# Patient Record
Sex: Male | Born: 1975 | Hispanic: Yes | Marital: Married | Smoking: Former smoker
Health system: Southern US, Community
[De-identification: ages and names within clinical notes are randomized; demographics above are authoritative.]

## PROBLEM LIST (undated history)

## (undated) DIAGNOSIS — M549 Dorsalgia, unspecified: Secondary | ICD-10-CM

## (undated) DIAGNOSIS — G43909 Migraine, unspecified, not intractable, without status migrainosus: Secondary | ICD-10-CM

## (undated) DIAGNOSIS — K6389 Other specified diseases of intestine: Secondary | ICD-10-CM

## (undated) DIAGNOSIS — D649 Anemia, unspecified: Secondary | ICD-10-CM

## (undated) DIAGNOSIS — S6990XA Unspecified injury of unspecified wrist, hand and finger(s), initial encounter: Secondary | ICD-10-CM

## (undated) DIAGNOSIS — K219 Gastro-esophageal reflux disease without esophagitis: Secondary | ICD-10-CM

## (undated) DIAGNOSIS — S83006A Unspecified dislocation of unspecified patella, initial encounter: Secondary | ICD-10-CM

## (undated) DIAGNOSIS — S80819A Abrasion, unspecified lower leg, initial encounter: Secondary | ICD-10-CM

## (undated) DIAGNOSIS — E785 Hyperlipidemia, unspecified: Secondary | ICD-10-CM

## (undated) HISTORY — DX: Migraine, unspecified, not intractable, without status migrainosus: G43.909

## (undated) HISTORY — DX: Abrasion, unspecified lower leg, initial encounter: S80.819A

## (undated) HISTORY — DX: Hyperlipidemia, unspecified: E78.5

## (undated) HISTORY — DX: Unspecified injury of unspecified wrist, hand and finger(s), initial encounter: S69.90XA

## (undated) HISTORY — DX: Dorsalgia, unspecified: M54.9

## (undated) HISTORY — DX: Gastro-esophageal reflux disease without esophagitis: K21.9

## (undated) HISTORY — DX: Unspecified dislocation of unspecified patella, initial encounter: S83.006A

## (undated) HISTORY — DX: Other specified diseases of intestine: K63.89

---

## 2008-08-28 ENCOUNTER — Emergency Department (HOSPITAL_COMMUNITY): Admission: EM | Admit: 2008-08-28 | Discharge: 2008-08-28 | Payer: Self-pay | Admitting: Emergency Medicine

## 2009-08-23 ENCOUNTER — Ambulatory Visit: Payer: Self-pay | Admitting: Family Medicine

## 2009-08-23 DIAGNOSIS — M549 Dorsalgia, unspecified: Secondary | ICD-10-CM | POA: Insufficient documentation

## 2009-09-29 ENCOUNTER — Ambulatory Visit: Payer: Self-pay | Admitting: Family Medicine

## 2009-09-29 DIAGNOSIS — R040 Epistaxis: Secondary | ICD-10-CM

## 2009-09-29 DIAGNOSIS — R209 Unspecified disturbances of skin sensation: Secondary | ICD-10-CM | POA: Insufficient documentation

## 2009-10-04 ENCOUNTER — Telehealth: Payer: Self-pay | Admitting: *Deleted

## 2009-12-21 ENCOUNTER — Ambulatory Visit: Payer: Self-pay | Admitting: Family Medicine

## 2009-12-21 ENCOUNTER — Telehealth: Payer: Self-pay | Admitting: Family Medicine

## 2009-12-21 DIAGNOSIS — M999 Biomechanical lesion, unspecified: Secondary | ICD-10-CM | POA: Insufficient documentation

## 2010-01-11 ENCOUNTER — Ambulatory Visit: Payer: Self-pay | Admitting: Family Medicine

## 2010-01-11 DIAGNOSIS — E785 Hyperlipidemia, unspecified: Secondary | ICD-10-CM | POA: Insufficient documentation

## 2010-01-11 DIAGNOSIS — M25579 Pain in unspecified ankle and joints of unspecified foot: Secondary | ICD-10-CM

## 2010-01-11 DIAGNOSIS — R6882 Decreased libido: Secondary | ICD-10-CM | POA: Insufficient documentation

## 2010-01-17 ENCOUNTER — Encounter: Payer: Self-pay | Admitting: Family Medicine

## 2010-01-17 ENCOUNTER — Ambulatory Visit: Payer: Self-pay | Admitting: Family Medicine

## 2010-01-17 LAB — CONVERTED CEMR LAB
Cholesterol: 174 mg/dL (ref 0–200)
HDL: 40 mg/dL (ref >39)
Total CHOL/HDL Ratio: 4.4

## 2010-01-31 ENCOUNTER — Ambulatory Visit: Payer: Self-pay | Admitting: Family Medicine

## 2010-01-31 DIAGNOSIS — S83006A Unspecified dislocation of unspecified patella, initial encounter: Secondary | ICD-10-CM

## 2010-01-31 HISTORY — DX: Unspecified dislocation of unspecified patella, initial encounter: S83.006A

## 2010-02-17 ENCOUNTER — Encounter: Payer: Self-pay | Admitting: Family Medicine

## 2010-02-17 ENCOUNTER — Ambulatory Visit (HOSPITAL_COMMUNITY): Admission: RE | Admit: 2010-02-17 | Discharge: 2010-02-17 | Payer: Self-pay | Admitting: Family Medicine

## 2010-02-17 ENCOUNTER — Ambulatory Visit: Payer: Self-pay | Admitting: Family Medicine

## 2010-02-17 DIAGNOSIS — H6690 Otitis media, unspecified, unspecified ear: Secondary | ICD-10-CM | POA: Insufficient documentation

## 2010-02-18 ENCOUNTER — Encounter: Payer: Self-pay | Admitting: Family Medicine

## 2010-03-22 ENCOUNTER — Ambulatory Visit: Payer: Self-pay | Admitting: Family Medicine

## 2010-05-18 ENCOUNTER — Ambulatory Visit: Admit: 2010-05-18 | Payer: Self-pay

## 2010-05-24 NOTE — Letter (Signed)
Summary: Generic Letter  [facility]  [address]   [place], [place] [address]   Phone: [phone]  Fax: [phone]    02/18/2010  [facility] [address] [place], [place]  [address]  Dear Mr. JAIMES-GARCIA,     I have reviewed your xray results; it showed some mild degenerative spurring, but no acute boney abnormalities.  That means that no bones are broken and you have some chronic bone changes.  I think it is reasonable to try physical therapy at this point to see if that will help with the pain.  Please call my office when you have a chance and let me know if you would like to proceed with setting up physical therapy.       Sincerely,   Alvia Grove DO  Appended Document: Generic Letter mailed.
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
## 2010-05-24 NOTE — Progress Notes (Signed)
  Phone Note Call from Patient   Caller: Patient Summary of Call: Need to come in for workin because he's been feeling shaking since last Friday.  Do not have a cold or fever.  Just not feeling right.  Have an appt with Dr. Gomez Cleverly on 9/20.  Call him back at [phone] Initial call taken by: Britta Mccreedy mcgregor  Follow-up for Phone Call        states this has never happened before. using ibu for the pain. happens off & on. unclear what triggers it or how it stops. affects the whole body. to see Dr. Katrinka Blazing this pm Follow-up by: Golden Circle RN,  December 21, 2009 9:56 AM
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
## 2010-05-24 NOTE — Assessment & Plan Note (Signed)
Summary: flu shot/eo  Nurse Visit   Vital Signs:  Patient profile:   35 year old male Temp:     97.9 degrees F oral  Vitals Entered By: Theresia Lo RN (March 22, 2010 10:29 AM)  Allergies: No Known Drug Allergies  Immunizations Administered:  Influenza Vaccine # 1:    Vaccine Type: Fluvax 3+    Site: right deltoid    Mfr: [employer]Smith[employer]    Dose: 0.5 ml    Route: IM    Given by: Theresia Lo RN    Exp. Date: 10/22/2010    Lot #: [id number]    VIS given: 11/16/09 version given March 22, 2010.  Flu Vaccine Consent Questions:    Do you have a history of severe allergic reactions to this vaccine? no    Any prior history of allergic reactions to egg and/or gelatin? no    Do you have a sensitivity to the preservative Thimersol? no    Do you have a past history of Guillan-Barre Syndrome? no    Do you currently have an acute febrile illness? no    Have you ever had a severe reaction to latex? no    Vaccine information given and explained to patient? yes  Orders Added: 1)  Flu Vaccine 47yrs + [90658] 2)  Admin 1st Vaccine [30865]
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
## 2010-05-24 NOTE — Letter (Signed)
Summary: Lipid Letter  [facility]  [address]   [place], [place] [address]   Phone: [phone]  Fax: [phone]    02/17/2010  [facility] [address] [place], [place]  [address]  Dear Kelby Fam:  We have carefully reviewed your last lipid profile from 01/17/2010 and the results are noted below with a summary of recommendations for lipid management.    Cholesterol:       174     Goal: < 200   HDL "good" Cholesterol:   40     Goal: > 40   LDL "bad" Cholesterol:   119     Goal: < 120   Triglycerides:       76     Goal: < 150        TLC Diet (Therapeutic Lifestyle Change): Saturated Fats & Transfatty acids should be kept < 7% of total calories ***Reduce Saturated Fats Polyunstaurated Fat can be up to 10% of total calories Monounsaturated Fat Fat can be up to 20% of total calories Total Fat should be no greater than 25-35% of total calories Carbohydrates should be 50-60% of total calories Protein should be approximately 15% of total calories Fiber should be at least 20-30 grams a day ***Increased fiber may help lower LDL Total Cholesterol should be < 200mg /day Consider adding plant stanol/sterols to diet (example: Benacol spread) ***A higher intake of unsaturated fat may reduce Triglycerides and Increase HDL    Adjunctive Measures (may lower LIPIDS and reduce risk of Heart Attack) include: Aerobic Exercise (20-30 minutes 3-4 times a week) Limit Alcohol Consumption Weight Reduction Aspirin 75-81 mg a day by mouth (if not allergic or contraindicated) Dietary Fiber 20-30 grams a day by mouth     Current Medications: 1)    Px Ibuprofen 200 Mg Tabs (Ibuprofen) .... Take 3 tab by mouth three times a day as needed for back pain 2)    Flector 1.3 % Ptch (Diclofenac epolamine) .... Apply one patch q12 hours, up to 3 at a time 3)    Keflex 500 Mg Caps (Cephalexin) .Marland Kitchen.. 1 pill by mouth three times a day x 10 days  If you have any questions, please  call. We appreciate being able to work with you.   Sincerely,    [facility] . [facility]
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
## 2010-05-24 NOTE — Progress Notes (Signed)
Summary: meds prob  Phone Note Call from Patient Call back at Home Phone [phone]   Caller: Patient Summary of Call: pt states the [facility]'t get diclofenac anymore and needs it called to [facility] Initial call taken by: De Nurse,  October 04, 2009 3:46 PM  Follow-up for Phone Call       Follow-up by: Golden Circle RN,  October 04, 2009 3:48 PM    Prescriptions: DICLOFENAC SODIUM 75 MG TBEC (DICLOFENAC SODIUM) one by mouth two times a day; take with food  #60 x 1   Entered by:   Golden Circle RN   Authorized by:   Myrtie Soman  MD   Signed by:   Golden Circle RN on 10/04/2009   Method used:   Electronically to        [facility] [address] (retail)       [phone]       [place], [place]  [address]       Ph: [phone]       Fax: [phone]   RxID:   [id number]
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
## 2010-05-24 NOTE — Assessment & Plan Note (Signed)
Summary: f/u,df   Vital Signs:  Patient profile:   35 year old male Height:      64.5 inches Weight:      145.8 pounds BMI:     24.73 Temp:     98.5 degrees F oral Pulse rate:   57 / minute BP sitting:   107 / 62  (left arm) Cuff size:   regular  Vitals Entered By: Garen Grams LPN (January 11, 2010 4:44 PM) Is Patient Diabetic? No Pain Assessment Patient in pain? yes     Location: rt ankle   Primary Care Provider:  . [facility]   History of Present Illness: 35 yo male here for f/u ankle pain, concern of erectile dysfunction and back apin.  1. Ankle pain: Had a known injury to right ankle about 10 years ago while playing soccer.  Didn't break any bones, but used a soft cast for about 4 weeks.  Had returned to full function until about 6 mo ago, no injury, but pt states it has been aching and sore.  Pain is increasing and pt wonders if he should wear a brace?  Able to walk and perform ADL's, but pain is now nagging.  Has not tried anything for relief. 2. Erectile dysfunction:  Pt has been unable to retain erection during intercourse with his wife.  This has been happening for the past 8 months, but was intermittenet, now it is constant.  Able to get an erection, but cannot maintain it.  3. Back pain: low back and chronic, seen multiple different provider and dx has been msk. Pai radiates sometimes, worse at the end of a long day.  No weakness, somme numbness on outside of left leg since an injury when a kid but nothing new. Denies bowel or bladder problems.   Habits & Providers  Alcohol-Tobacco-Diet     Tobacco Status: never  Current Problems (verified): 1)  Hyperlipidemia  (ICD-272.4) 2)  Ankle Pain, Right  (ICD-719.47) 3)  Decreased Libido  (ICD-799.81) 4)  Somatic Dysfunction, Lumbar  (ICD-739.3) 5)  Disturbance of Skin Sensation  (ICD-782.0) 6)  Epistaxis, Recurrent  (ICD-784.7) 7)  Back Pain, Chronic  (ICD-724.5)  Current Medications (verified): 1)  Px  Ibuprofen 200 Mg Tabs (Ibuprofen) .... Take 3 Tab By Mouth Three Times A Day As Needed For Back Pain  Allergies (verified): No Known Drug Allergies  Family History: Reviewed history from 08/23/2009 and no changes required. no HTN, diabetes, cancer  does not know father's side of the family  no premature MI / CAD  Social History: Reviewed history from 08/23/2009 and no changes required. 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   Review of Systems  The patient denies anorexia, fever, weight loss, weight gain, vision loss, decreased hearing, hoarseness, chest pain, syncope, dyspnea on exertion, peripheral edema, prolonged cough, headaches, hemoptysis, abdominal pain, melena, hematochezia, severe indigestion/heartburn, hematuria, incontinence, genital sores, muscle weakness, suspicious skin lesions, transient blindness, difficulty walking, depression, unusual weight change, abnormal bleeding, enlarged lymph nodes, angioedema, breast masses, and testicular masses.    Physical Exam  General:  Vs reviewed, alert, well-developed, well-nourished, and well-hydrated.   Lungs:  work of breathing unlabored, clear to auscultation bilaterally; no wheezes, rales, or ronchi; good air movement throughout  Heart:  regular rate and rhythm, no murmurs; normal s1/s2  Msk:  back exam: normal  to inspection; no bony point tenderness; mild R paralumbar tenderness. Normal gait. Smooth easy transfers. SL test negative, , faber negative, mild pain with extension    Foot/Ankle Exam  Reflexes:    Normal and symmetric Achilles reflexes bilaterally.    Ankle Exam:    Right:    Inspection:  Normal    Palpation:  Normal    Stability:  stable    Tenderness:  no    Swelling:  no    Erythema:  no    Range of Motion:       Dorsiflex-Active: 60       Plantar Flex-Active: 45        Eversion-Active: 45       Inversion-Active: 75       Dorsiflex-Passive: 60       Plantar Flex-Passive: 45       Eversion-Passive: 45       Inversion-Passive: 75       Transverse Tarsal: full    Left:    Inspection:  Normal    Palpation:  Normal    Stability:  stable    Tenderness:  no    Swelling:  no    Erythema:  no    Range of Motion:       Dorsiflex-Active: 60       Plantar Flex-Active: 45       Eversion-Active: 45       Inversion-Active: 75       Dorsiflex-Passive: 60       Plantar Flex-Passive: 45       Eversion-Passive: 45       Inversion-Passive: 75       Transverse Tarsal: full  Anterior Drawer:    Right negative; Left negative Syndesmosis Squeeze Test:    Right negative; Left negative Ankle External Rotation Test:    Right negative; Left negative First MTP Stability Test:    Right negative; Left negative Drawer Text of Lesser Toe MTP:    Right negative; Left negative   Impression & Recommendations:  Problem # 1:  ANKLE PAIN, RIGHT (ICD-719.47) see pt instructions Orders: [facility]- Est  Level 4 (24401)  Problem # 2:  HYPERLIPIDEMIA (ICD-272.4) personal hx of will check FLP Future Orders: Lipid-[facility] ([id number]) ... 01/17/2010  Problem # 3:  DECREASED LIBIDO (ICD-799.81) Assessment: New > 20 min spent counseling Pt willing to talk to wife about this Declines desire for meds. 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 Take 3 tab by mouth three times a day as needed for back pain  Patient Instructions: 1)  Great to see you today.  2)  Please stop by the office one morning and have your cholestrol checked. 3)  Please [facility] about your right ankle.  [phone]

## 2010-06-09 ENCOUNTER — Encounter: Payer: Self-pay | Admitting: *Deleted

## 2010-06-27 ENCOUNTER — Telehealth: Payer: Self-pay | Admitting: *Deleted

## 2010-06-27 ENCOUNTER — Inpatient Hospital Stay (INDEPENDENT_AMBULATORY_CARE_PROVIDER_SITE_OTHER)
Admission: RE | Admit: 2010-06-27 | Discharge: 2010-06-27 | Disposition: A | Discharge status: Home / Self Care | Payer: Self-pay | Source: Ambulatory Visit | Attending: Family Medicine | Admitting: Family Medicine

## 2010-06-27 DIAGNOSIS — K5289 Other specified noninfective gastroenteritis and colitis: Secondary | ICD-10-CM

## 2010-06-27 NOTE — Telephone Encounter (Signed)
Walked in c/o n&v, fever,cough. No appts left for today. Agreed to go to [facility].Faustino Congress

## 2010-12-19 ENCOUNTER — Encounter: Payer: Self-pay | Admitting: Family Medicine

## 2010-12-19 ENCOUNTER — Ambulatory Visit (INDEPENDENT_AMBULATORY_CARE_PROVIDER_SITE_OTHER): Payer: Self-pay | Admitting: Family Medicine

## 2010-12-19 DIAGNOSIS — G43909 Migraine, unspecified, not intractable, without status migrainosus: Secondary | ICD-10-CM | POA: Insufficient documentation

## 2010-12-19 MED ORDER — PROPRANOLOL HCL 20 MG PO TABS: 20.0000 mg | ORAL_TABLET | Freq: Two times a day (BID) | ORAL | Status: DC

## 2010-12-19 NOTE — Patient Instructions (Signed)
It was so nice to meet you! I am starting you on a medicine to hopefully help prevent your headaches.  Please take 1 tablet 2 times per day.  We will likely be able to increase this dose but I would like to see you back before we do that. Please see Dr. Mauricio Po in about 4 weeks so he can check on how you are doing.  I am also sending in a referral so that you can see an eye doctor.

## 2010-12-19 NOTE — Progress Notes (Signed)
S: Pt comes in today for headaches.  Has been having these for a long time, 6-7 years.  Occur more or less daily, although there is occasionally a day where he does not have one.  Usually frontal from just above eyes to mid head.  Does not wake up with headaches, light and noise make them worse.  Sleep usually helps.  Occasionally vomits with them, then HA goes away.  Closing eyes makes it better, Tylenol also helps some, although some times does not completely make HA go away.  Usually the HA comes on gradually, but occasionally it will come on quickly.  Does not think there is any taste, smell, or visual aura prior to these headaches.  Does occasionally feels like he has a shimmering out of his peripheral vision out of his left eye.  Works outside as a Administrator.  Does not smoke, but is exposed to smoke.   ROS: Per HPI  History  Smoking status  . Never Smoker   Smokeless tobacco  . Never Used    O:  Filed Vitals:   12/19/10 1512  BP: 122/78  Pulse: 62  Temp: 98.3 F (36.8 C)    Gen: NAD HEENT: EOMI, PERRLA, MMM, pharynx clear w/o erythema or exudate, fundi nml on optho exam  CV: RRR, no murmur Pulm: CTA bilat, no wheezes or crackles   A/P: 35 y.o. male p/w headaches -See problem list -f/u in 1 month

## 2010-12-19 NOTE — Assessment & Plan Note (Signed)
Symptoms c/w migraine.  Will start propranolol 20mg  BID.  Could likely increase this at next visit.  Will have him f/u w/ PCP in 1 month to discuss if this is helping and for BP check. Will also send to optho for eye check.

## 2011-01-17 ENCOUNTER — Ambulatory Visit: Payer: Self-pay | Admitting: Family Medicine

## 2011-01-19 ENCOUNTER — Encounter: Payer: Self-pay | Admitting: Family Medicine

## 2011-01-19 ENCOUNTER — Ambulatory Visit (INDEPENDENT_AMBULATORY_CARE_PROVIDER_SITE_OTHER): Payer: Self-pay | Admitting: Family Medicine

## 2011-01-19 VITALS — BP 103/66 | HR 60 | Wt 141.9 lb

## 2011-01-19 DIAGNOSIS — G43909 Migraine, unspecified, not intractable, without status migrainosus: Secondary | ICD-10-CM

## 2011-01-19 DIAGNOSIS — Z23 Encounter for immunization: Secondary | ICD-10-CM

## 2011-01-19 DIAGNOSIS — H5340 Unspecified visual field defects: Secondary | ICD-10-CM

## 2011-01-19 MED ORDER — AMITRIPTYLINE HCL 10 MG PO TABS: 10.0000 mg | ORAL_TABLET | Freq: Every day | ORAL | Status: DC

## 2011-01-19 MED ORDER — NAPROXEN 500 MG PO TBEC: 500.0000 mg | DELAYED_RELEASE_TABLET | Freq: Two times a day (BID) | ORAL | Status: AC | PRN

## 2011-01-19 NOTE — Assessment & Plan Note (Signed)
Since did not get any decrease in migraine days from propranolol and pulse is low, will discontinue and start amitriptyline 10 mg, start 10 mg qhs, titrate up to 20 before retuning in one month.  I gave patient a headache log to keep to identify triggers.  Will follow-up in 1 month with PCP.

## 2011-01-19 NOTE — Assessment & Plan Note (Signed)
Left eye 20/20.  No neurological findings.  Was unable to be referred to ophtho by last doctor due to uninsured status.  I gave him contact info for several offices and advised he contact them to discuss payment plans or cash payment for evaluation.

## 2011-01-19 NOTE — Progress Notes (Signed)
  Subjective:    Patient ID: William Blankenship, male    DOB: 05-Dec-1975, 35 y.o.   MRN: [id number]  HPISpeaks english, spanish is primary language.  Here for 1 month follow-up.  Saw Dr. Fara Boros. Dx with migraine.  Started on propranolol 20 bid.  Patient noted some improvement in the first two weeks in pain level, down to a 2, but the past 2 weeks pain has been more severe.  He continued to have daily headaches throughout the month.  He has stopped taking OTC pain medications completely without improvement in pain.  Reviewed symptoms- i agree- consistent with migraine- history of many years of migraine pain, worsening over the past few years.  Associated with nausea, frontal and right sided head pain with neck tension.  + photophobia.    No fever, numbness, weakness, tingling.  Does not several months of left sided visual change.  Describes it as "water" - a spot in his left eye field of vision that is persistent.  He tried to wipe it away as it seems like it is on his eyelash.  Denies floaters, expanding loss of vision.  No trauma, but may have gotten some yard material in his eye several days before onset of this.     Review of Systems See HPI    Objective:   Physical Exam GEN: Alert & Oriented, No acute distress CV:  Regular Rate & Rhythm, no murmur Respiratory:  Normal work of breathing, CTAB Abd:  + BS, soft, no tenderness to palpation Ext: no pre-tibial edema NEURO: CN 2-12 intact.  PERRLA< no conjunctival irritation.  EOMI.  undilated Fundoscopic exam grossly normal.  Patellar reflexes 2+ bilaterally.       Assessment & Plan:  Prevention: gave TDAP today and flu vaccine.

## 2011-01-19 NOTE — Patient Instructions (Signed)
Keep headache log See list of eye doctors to call and ask about.  New medicine amytriptyline- can make your sleepy Use naproxen when pain gets bad, but try not to use more than twice a week, any pain medicine can make headaches worse. Follow-up in 1 month

## 2011-03-06 ENCOUNTER — Ambulatory Visit (INDEPENDENT_AMBULATORY_CARE_PROVIDER_SITE_OTHER): Payer: Self-pay | Admitting: Family Medicine

## 2011-03-06 ENCOUNTER — Encounter: Payer: Self-pay | Admitting: Family Medicine

## 2011-03-06 DIAGNOSIS — M7710 Lateral epicondylitis, unspecified elbow: Secondary | ICD-10-CM

## 2011-03-06 DIAGNOSIS — E785 Hyperlipidemia, unspecified: Secondary | ICD-10-CM

## 2011-03-06 DIAGNOSIS — R6882 Decreased libido: Secondary | ICD-10-CM

## 2011-03-06 DIAGNOSIS — M7711 Lateral epicondylitis, right elbow: Secondary | ICD-10-CM | POA: Insufficient documentation

## 2011-03-06 DIAGNOSIS — R51 Headache: Secondary | ICD-10-CM

## 2011-03-06 MED ORDER — AMITRIPTYLINE HCL 25 MG PO TABS: 25.0000 mg | ORAL_TABLET | Freq: Every day | ORAL | Status: DC

## 2011-03-06 NOTE — Assessment & Plan Note (Signed)
Given the improvement with amitriptyline, strongly favors migraine. In the setting of someone with history of head trauma that precedes headache onset, would strongly consider some form of neuroimaging if not adequately controlled with the Elavil. Also with plans to use a pHq9 to screen for depression at next visit.

## 2011-03-06 NOTE — Patient Instructions (Signed)
Fue un Research officer, trade union.   Para los dolores de [place], quiero que siga tomando la Elavil 20mg  diario, cada noche. Quiero que venga a [employer] estudios de sangre en ayunas 8 horas, para Personnel officer, los rinones, Catering manager.  Para el codo derecho, quiero que doble una toalla de bano y la envuelva por el codo de noche al [place].  Se puede utilizar un "Tennis Elbow Strap" durante el dia. Puede usar ibuprofeno 400 a 800mg  cada 8 horas por hasta 5dias seguidos.    Quiero verle de nuevo para ver como sigue.  FASTING LAB APPOINTMENT WHEN PATIENT AVAILABLE; DR Mauricio Po FOLLOW UP AFTERWARD.

## 2011-03-06 NOTE — Assessment & Plan Note (Signed)
Some concerns for clinical depression, given his emotional lability when talking about this issue. Will screen for certain organic conditions which may impact erectile function, such as diabetes, thyroid disease, and we'll check testosterone level as well. The family has small children, the oldest of which is 6 and the lack good supports to allow for time without the children. We talked today about creative ways to get some individual and couples time.
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
## 2011-03-15 ENCOUNTER — Other Ambulatory Visit: Payer: Self-pay

## 2011-03-15 DIAGNOSIS — R6882 Decreased libido: Secondary | ICD-10-CM

## 2011-03-15 DIAGNOSIS — E785 Hyperlipidemia, unspecified: Secondary | ICD-10-CM

## 2011-03-15 LAB — COMPREHENSIVE METABOLIC PANEL
Albumin: 4.5 g/dL (ref 3.5–5.2)
Alkaline Phosphatase: 82 U/L (ref 39–117)
BUN: 21 mg/dL (ref 6–23)
Glucose, Bld: 82 mg/dL (ref 70–99)
Potassium: 4.3 mEq/L (ref 3.5–5.3)
Total Bilirubin: 0.6 mg/dL (ref 0.3–1.2)

## 2011-03-15 LAB — LIPID PANEL
Cholesterol: 168 mg/dL (ref 0–200)
HDL: 44 mg/dL (ref >39)
LDL Cholesterol: 113 mg/dL — ABNORMAL HIGH (ref 0–99)
Triglycerides: 54 mg/dL (ref <150)

## 2011-03-15 LAB — CBC
HCT: 46.7 % (ref 39.0–52.0)
MCH: 30.2 pg (ref 26.0–34.0)
MCHC: 33.4 g/dL (ref 30.0–36.0)
RDW: 13.2 % (ref 11.5–15.5)

## 2011-03-15 NOTE — Progress Notes (Signed)
Labs done today Lexandra Rettke 

## 2011-03-17 LAB — TESTOSTERONE, FREE, TOTAL, SHBG: Sex Hormone Binding: 22 nmol/L (ref 13–71)

## 2011-03-20 ENCOUNTER — Encounter: Payer: Self-pay | Admitting: Family Medicine

## 2011-05-05 ENCOUNTER — Encounter: Payer: Self-pay | Admitting: Family Medicine

## 2011-05-05 ENCOUNTER — Ambulatory Visit (INDEPENDENT_AMBULATORY_CARE_PROVIDER_SITE_OTHER): Payer: Self-pay | Admitting: Family Medicine

## 2011-05-05 DIAGNOSIS — R51 Headache: Secondary | ICD-10-CM

## 2011-05-05 DIAGNOSIS — R6882 Decreased libido: Secondary | ICD-10-CM

## 2011-05-05 NOTE — Assessment & Plan Note (Addendum)
Reviewed normal labs looking for organic cause.  Stressed good news that labs are normal.  Try to break monotony and thus reduce performance anxiety.  Stress marital communication.  Keep log book of this.  May consider addition of wellbutrin for possible depression (or changing out Elavil for Wellbutrin, as he is wary of addition of medications).

## 2011-05-05 NOTE — Patient Instructions (Signed)
Fue un Research officer, trade union.  Es muy buena noticia que los laboratorios salieron normales.   Para los dolores de [place], siga con la amitriptyline de noche y la naproxen de dia cuando la necesite.   Mantenga un calendario de los dias en que [employer].   Chequeo con [place].  Para los problemas con la ereccion, trate de romper con la monotonia; hablemos de eso en su proxima visita en 4 a 6 semanas.  FOLLOW UP WITH DR Mauricio Po IN 4 TO 6 WEEKS.

## 2011-05-05 NOTE — Assessment & Plan Note (Signed)
Improved from last visit.  Comports like migraine headache.  No worrisome signs.  Headache diary.

## 2011-05-05 NOTE — Progress Notes (Signed)
  Subjective:    Patient ID: William Blankenship, male    DOB: 1976/01/17, 36 y.o.   MRN: [id number]  HPI Visit conducted in Spanish.  William Blankenship comes in to discuss a couple of problems:  1. HAs.  Has not had regular headaches since last visit; occasionally has them and associates with warmer weather.  Last one at work about 1 week ago; no visual changes or motor changes.  Resolved spontaneously in about 3 hrs.  Usually has naproxen at home which aborts the HA completely.  No waking with HA.  Still taking Elavil at bedtime.  2. Sexual performance.  Unchanged, possibly a little worse.  Stressed because of friction with wife over his difficulty maintaining erection.  Has occasionally had spontaneous erection in morning when wakes up.  Feels somewhat depressed over this issue; does not do as many leisure activities as usual, more because of family responsibilities (spending time with his children) and knee problems which keep him off the soccer field.  Enjoys fishing in lakes, does this with his son.  Relaxes him.    3. Dry contracted lips, more in upper lip.  Somewhat better today.  No cold sores or aphthous ulcers in mouth.  Not painful.   Review of Systems Notes somewhat better vision OS than OD.  No blurry vision.  See HPI for additional ROS items.    Objective:   Physical Exam Well appearing, No apparent distress HEENT neck supple. No adenopathy. Cranial nerves intact with gross exam.  Clear mucus membranes in mouth and lips, healthy gingiva and dentition.        Assessment & Plan:

## 2012-05-07 ENCOUNTER — Ambulatory Visit (INDEPENDENT_AMBULATORY_CARE_PROVIDER_SITE_OTHER): Payer: No Typology Code available for payment source | Admitting: Family Medicine

## 2012-05-07 ENCOUNTER — Encounter: Payer: Self-pay | Admitting: Family Medicine

## 2012-05-07 VITALS — BP 120/82 | HR 52 | Temp 97.9°F | Ht 64.4 in | Wt 151.5 lb

## 2012-05-07 DIAGNOSIS — R51 Headache: Secondary | ICD-10-CM

## 2012-05-07 MED ORDER — GUAIFENESIN ER 600 MG PO TB12: 1200.0000 mg | ORAL_TABLET | Freq: Two times a day (BID) | ORAL | Status: DC

## 2012-05-07 MED ORDER — FLUTICASONE PROPIONATE 50 MCG/ACT NA SUSP: 2.0000 | Freq: Every day | NASAL | Status: DC

## 2012-05-07 MED ORDER — PSEUDOEPHEDRINE HCL 60 MG PO TABS: 60.0000 mg | ORAL_TABLET | ORAL | Status: DC | PRN

## 2012-05-07 MED ORDER — SODIUM CHLORIDE 0.65 % NA SOLN: 1.0000 | NASAL | Status: DC | PRN

## 2012-05-07 NOTE — Patient Instructions (Addendum)
Creo que el dolor de [place] y de los oidos se debe a congestion en los senos nasales.  QUiero que se aplique agua salina a la William Blankenship tres a 4 veces por dia.   USe FLONASE espray nasal, 2 soplidos en cada fosa nasal, por la manana.  Usela despues de aplicarse el agua salina.   MUCINEX 600mg , 2 tabletas cada 12 horas, para hacer menos espesas las secreciones nasales.  Sudafed 60mg , una tableta cada 6 horas por 5 dias seguidos.  Otro aspecto del dolor de cabeza puede tener que ver con el consumo de la cafeina, si esta' tomando jugos de energia con mucha cafeina.  El Lawyer cafeina puede hacer peor el dolor de [place].

## 2012-05-08 NOTE — Assessment & Plan Note (Signed)
Headache with characteristics of sinus headache, findings on exam which support this diagnosis, as well as serous effusion behind TMs.  Will treat with mucolytics, nasal steroids, decongestants.  I advise him to avoid the caffeinated energy drinks, as there may be a component of caffeine-withdrawal headache as well.  He notes the onset of the headaches in the past 10 years; possible contribution of hx healed, malaligned nasal fracture.
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
## 2012-07-29 ENCOUNTER — Encounter: Payer: Self-pay | Admitting: Family Medicine

## 2012-07-29 ENCOUNTER — Ambulatory Visit (INDEPENDENT_AMBULATORY_CARE_PROVIDER_SITE_OTHER): Payer: No Typology Code available for payment source | Admitting: Family Medicine

## 2012-07-29 VITALS — BP 132/86 | HR 49 | Ht 64.5 in | Wt 150.4 lb

## 2012-07-29 DIAGNOSIS — S80812A Abrasion, left lower leg, initial encounter: Secondary | ICD-10-CM

## 2012-07-29 DIAGNOSIS — S80819A Abrasion, unspecified lower leg, initial encounter: Secondary | ICD-10-CM

## 2012-07-29 DIAGNOSIS — IMO0002 Reserved for concepts with insufficient information to code with codable children: Secondary | ICD-10-CM

## 2012-07-29 HISTORY — DX: Abrasion, unspecified lower leg, initial encounter: S80.819A

## 2012-07-29 MED ORDER — TRAMADOL HCL 50 MG PO TABS: 50.0000 mg | ORAL_TABLET | Freq: Three times a day (TID) | ORAL | Status: DC | PRN

## 2012-07-29 NOTE — Progress Notes (Signed)
S: Pt comes in today for SDA for leg pain after cutting a branch.  He reports he was cutting down branches 10 days ago and a branch hit is leg- he would not move it out of the way in time.  It is hurting the same amount today as the day that he did it.  The scrapes on his leg did not bleed much.  He has been using neosporin and keeping the wounds clean.  He has been taking Tylenol for pain, which isn't helping much.  He did have some chills yesterday, without a fever, but he thinks this was because he was working.  The cuts have not been draining or oozing; he did have bleeding from one of the cuts last week after wearing boots at work.  He had vomiting x1 with 1 episode of diarrhea 3 days ago.  Otherwise, no fevers/N/V/D/dizziness. Having some ankle pain in addition to shin pain.  Full ROM in his ankle, no swelling, just uncomfortable when he flexes it.  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  He cannot be out of work, but is interested in something for pain.   ROS: Per HPI  History  Smoking status  . Passive Smoke Exposure - Never Smoker  Smokeless tobacco  . Never Used    O:  Filed Vitals:   07/29/12 1527  BP: 132/86  Pulse: 49    Gen: NAD Ext: L shin with 2 midline cuts- proximal cut ~8cm, distal cut ~5cm; distal cut with obvious granulation tissue; both cuts relatively thin and superficial; no drainage; no surrounding erythema or warmth Full ROM of L ankle without edema or erythema.    A/P: 37 y.o. male p/w L shin abrasion  -See problem list -f/u in PRN

## 2012-07-29 NOTE — Assessment & Plan Note (Addendum)
Appears to be healing well; pain is pt's biggest complaint- not helped by Tylenol.  Will give Rx for tramadol.  Tegaderm placed over abrasion in clinic.  Red flags for infection discussed; f/u in 1-2 weeks if desired, otherwise f/u PRN.

## 2012-07-29 NOTE — Patient Instructions (Addendum)
It was nice to meet you.  Your leg looks like it is healing very well.  Keep the bandage on it.   If you start having fevers/chills, drainage from the cut, or redness around the cuts gets worse, please come back.  If you would like, we can look at it again in 1-2 weeks.   Otherwise, come back to see Dr. Mauricio Po as needed.    Fue Curator.   Su pierna parece que se est recuperando [employer].   Mantenga el vendaje en l.   Si usted comienza a Regulatory affairs officer / escalofros, secrecin de la corte, o enrojecimiento alrededor de los cortes empeora, por favor vuelve. Si usted lo desea, podemos mirar de nuevo en 1-2 semanas.   De lo contrario, volver a ver al Dr. Mauricio Po, segn sea necesario.    Raspaduras (Abrasions) Las raspaduras son erosiones en la piel. Su tratamiento depende de la extensin y la profundidad de la raspadura. Las raspaduras no se extienden a travs de todas las capas de la piel. Un corte o lesin que involucre todas las capas de la piel se denomina laceracin. INSTRUCCIONES PARA EL CUIDADO DOMICILIARIO  Si le han colocado un vendaje, cmbielo por lo menos una vez por da o segn lo que le recomiende el profesional que lo asiste. Si el vendaje se adhiere, remjelo con una solucin de agua o perxido de hidrgeno [place]  Lave la zona con agua y [address] veces por da para quitarse toda la crema o el ungento. Esto puede hacerlo en el lavabo, bajo el grifo de la tina o en la ducha. Enjuague el jabn y squese dando pequeos golpes con una toalla limpia. Observe si existen signos de infeccin (ver ms abajo).  Vuelva a aplicar crema o ungento segn las indicaciones del profesional que lo asiste. Esto le ayudar a prevenir las infecciones y a Automotive engineer que el vendaje se Building services engineer. Una [place] o gasa sobre la herida y debajo del vendaje tambin evitar que ste se Building services engineer.  Si el vendaje se moja, se ensucia o presenta un olor ftido, cmbielo tan pronto como  pueda.  Utilice los medicamentos de venta libre o de prescripcin para Chief Technology Officer, Environmental health practitioner o la [place], segn se lo indique el profesional que lo asiste. SOLICITE ATENCIN MDICA DE INMEDIATO SI:  Aumento del dolor en la zona de la herida.  Presenta signos de infeccin: enrojecimiento, hinchazn, la zona que rodea la herida est sensible al tacto u observa pus en la zona de la herida.  Tiene fiebre.  Un olor ftido que provenga de la herida o del vendaje. La mayor parte de las heridas de la piel se curan en menos de [place]. Las heridas faciales se curan ms rpidamente. Sin embargo, puede suceder que aparezca una infeccin a pesar de que se haya aplicado el tratamiento correcto. Deber controlar si existen signos de infeccin en la herida dentro de las 24 a 48 hs. o antes si surge algn problema. Si no le han concertado una cita para el control de la herida, obsrvela atentamente al [employer], para [employer] primeros signos de infeccin que se han enumerado ms [place]. EST SEGURO QUE:   Comprende las instrucciones para el alta mdica.  Controlar su enfermedad.  Solicitar atencin mdica de inmediato segn las indicaciones. Document Released: 04/10/2005 Document Revised: 07/03/2011 [facility] Patient Information 2013 [place], [place].

## 2012-12-06 ENCOUNTER — Emergency Department (INDEPENDENT_AMBULATORY_CARE_PROVIDER_SITE_OTHER): Payer: No Typology Code available for payment source

## 2012-12-06 ENCOUNTER — Encounter (HOSPITAL_COMMUNITY): Payer: Self-pay | Admitting: Emergency Medicine

## 2012-12-06 ENCOUNTER — Emergency Department (INDEPENDENT_AMBULATORY_CARE_PROVIDER_SITE_OTHER)
Admission: EM | Admit: 2012-12-06 | Discharge: 2012-12-06 | Disposition: A | Discharge status: Home / Self Care | Payer: No Typology Code available for payment source | Source: Home / Self Care

## 2012-12-06 DIAGNOSIS — S20219A Contusion of unspecified front wall of thorax, initial encounter: Secondary | ICD-10-CM

## 2012-12-06 DIAGNOSIS — IMO0002 Reserved for concepts with insufficient information to code with codable children: Secondary | ICD-10-CM

## 2012-12-06 DIAGNOSIS — S80812A Abrasion, left lower leg, initial encounter: Secondary | ICD-10-CM

## 2012-12-06 DIAGNOSIS — S20212A Contusion of left front wall of thorax, initial encounter: Secondary | ICD-10-CM

## 2012-12-06 MED ORDER — MELOXICAM 15 MG PO TABS: 15.0000 mg | ORAL_TABLET | Freq: Every day | ORAL | Status: DC | PRN

## 2012-12-06 MED ORDER — TRAMADOL HCL 50 MG PO TABS: 50.0000 mg | ORAL_TABLET | Freq: Three times a day (TID) | ORAL | Status: DC | PRN
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
## 2012-12-06 NOTE — ED Provider Notes (Signed)
William Blankenship is a 37 y.o. male who presents to Urgent Care today for rib pain. 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 He notes continued pain especially worse with deep breaths and laughing. He's tried over-the-counter pain medications which have not helped much. He denies any radiating pain weakness or numbness difficulty breathing chest pain or palpitations. The pain is moderate.    PMH reviewed. Healthy otherwise History  Substance Use Topics  . Smoking status: Passive Smoke Exposure - Never Smoker  . Smokeless tobacco: Never Used  . Alcohol Use: No   ROS as above Medications reviewed. No current facility-administered medications for this encounter.   Current Outpatient Prescriptions  Medication Sig Dispense Refill  . amitriptyline (ELAVIL) 25 MG tablet Take 1 tablet (25 mg total) by mouth at bedtime.  30 tablet  6  . fluticasone (FLONASE) 50 MCG/ACT nasal spray Place 2 sprays into the nose daily.  16 g  6  . meloxicam (MOBIC) 15 MG tablet Take 1 tablet (15 mg total) by mouth daily as needed for pain.  30 tablet  0  . traMADol (ULTRAM) 50 MG tablet Take 1-2 tablets (50-100 mg total) by mouth every 8 (eight) hours as needed for pain.  30 tablet  0  . [DISCONTINUED] sodium chloride (OCEAN) 0.65 % nasal spray Place 1 spray into the nose as needed for congestion.  15 mL  12    Exam:  BP 129/79  Pulse 56  Temp(Src) 98.6 F (37 C) (Oral)  Resp 16  SpO2 99% Gen: Well NAD HEENT: EOMI,  MMM Lungs: CTABL Nl WOB Heart: RRR no MRG Abd: NABS, NT, ND Exts: Non edematous BL  LE, warm and well perfused.  Chest wall: No bruising or ecchymosis noted. Tender palpation left anterior chest wall inferior to the nipple. No crepitations noted on palpation  No results found for this or any previous visit (from the past 24 hour(s)). Dg Ribs Unilateral W/chest Left  12/06/2012   *RADIOLOGY REPORT*  Clinical  Data: Left rib injury Tuesday  LEFT RIBS AND CHEST - 3+ VIEW  Comparison: None.  Findings: Three views left ribs submitted.  Cardiomediastinal silhouette is unremarkable.  No acute infiltrate or pulmonary edema.  No left rib fracture is identified.  No diagnostic pneumothorax.  IMPRESSION: No left rib fracture.  No diagnostic pneumothorax.   Original Report Authenticated By: Natasha Mead, M.D.    Assessment and Plan: 37 y.o. male with rib contusion.  Plan for treatment with tramadol meloxicam.  Followup primary care provider if not improving Discussed warning signs or symptoms. Please see discharge instructions. Patient expresses understanding.      Rodolph Bong, MD 12/06/12 [phone]

## 2013-01-06 ENCOUNTER — Ambulatory Visit (INDEPENDENT_AMBULATORY_CARE_PROVIDER_SITE_OTHER): Payer: No Typology Code available for payment source | Admitting: Family Medicine

## 2013-01-06 VITALS — BP 113/75 | HR 58 | Temp 98.0°F | Ht 64.5 in | Wt 145.0 lb

## 2013-01-06 DIAGNOSIS — J329 Chronic sinusitis, unspecified: Secondary | ICD-10-CM

## 2013-01-06 DIAGNOSIS — J069 Acute upper respiratory infection, unspecified: Secondary | ICD-10-CM

## 2013-01-06 MED ORDER — AMOXICILLIN-POT CLAVULANATE 875-125 MG PO TABS: 1.0000 | ORAL_TABLET | Freq: Two times a day (BID) | ORAL | Status: DC
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
## 2013-01-06 NOTE — Patient Instructions (Addendum)
We are treating you for a chronic sinus infection. Please take the antibiotics twice daily for one month. Follow up with Dr. Mauricio Po in 3-4 weeks. We will schedule a CT scan of your sinuses as well.  For your virus, please continue to drink plenty of fluids and take Tylenol as needed for body aches.  Bentlee Drier M. Kylie Gros, M.D.

## 2013-01-06 NOTE — Assessment & Plan Note (Signed)
Known sick contacts, con't supportive care with fluids, tylenol and OTC medication. Return if fails to improve.

## 2013-01-06 NOTE — Assessment & Plan Note (Signed)
History of nasal trauma, now with persistent headaches and congestion. Given the severity of his pain and swelling on exam, will get sinus CT to evaluate degree of chronic sinusitis as well as for any type of structural abnormality. Will treat with Augmentin BID x4 weeks. Patient to follow up with PCP in 3-4 weeks.

## 2013-01-07 ENCOUNTER — Ambulatory Visit: Payer: No Typology Code available for payment source | Admitting: Family Medicine

## 2013-01-09 ENCOUNTER — Other Ambulatory Visit (HOSPITAL_COMMUNITY): Payer: No Typology Code available for payment source

## 2013-01-13 ENCOUNTER — Ambulatory Visit (HOSPITAL_COMMUNITY)
Admission: RE | Admit: 2013-01-13 | Discharge: 2013-01-13 | Disposition: A | Discharge status: Home / Self Care | Payer: No Typology Code available for payment source | Source: Ambulatory Visit | Attending: Family Medicine | Admitting: Family Medicine

## 2013-01-13 DIAGNOSIS — J3489 Other specified disorders of nose and nasal sinuses: Secondary | ICD-10-CM | POA: Insufficient documentation

## 2013-01-13 DIAGNOSIS — J329 Chronic sinusitis, unspecified: Secondary | ICD-10-CM

## 2013-02-14 ENCOUNTER — Encounter: Payer: Self-pay | Admitting: Family Medicine

## 2013-02-14 ENCOUNTER — Ambulatory Visit (INDEPENDENT_AMBULATORY_CARE_PROVIDER_SITE_OTHER): Payer: No Typology Code available for payment source | Admitting: Family Medicine

## 2013-02-14 VITALS — BP 121/74 | HR 65 | Temp 98.0°F | Wt 148.0 lb

## 2013-02-14 DIAGNOSIS — Z23 Encounter for immunization: Secondary | ICD-10-CM

## 2013-02-14 DIAGNOSIS — G43909 Migraine, unspecified, not intractable, without status migrainosus: Secondary | ICD-10-CM

## 2013-02-14 DIAGNOSIS — J329 Chronic sinusitis, unspecified: Secondary | ICD-10-CM

## 2013-02-14 DIAGNOSIS — R51 Headache: Secondary | ICD-10-CM

## 2013-02-14 MED ORDER — FLUTICASONE PROPIONATE 50 MCG/ACT NA SUSP: 2.0000 | Freq: Every day | NASAL | Status: DC

## 2013-02-14 MED ORDER — AMITRIPTYLINE HCL 25 MG PO TABS: 25.0000 mg | ORAL_TABLET | Freq: Every day | ORAL | Status: DC

## 2013-02-14 MED ORDER — SUMATRIPTAN SUCCINATE 50 MG PO TABS: 50.0000 mg | ORAL_TABLET | ORAL | Status: DC | PRN

## 2013-02-14 MED ORDER — KETOROLAC TROMETHAMINE 30 MG/ML IJ SOLN
30.0000 mg | Freq: Once | INTRAMUSCULAR | Status: AC
  Administered 2013-02-14: 30 mg via INTRAMUSCULAR

## 2013-02-14 NOTE — Assessment & Plan Note (Signed)
Given the nature of his headaches, I am suspicious for migraine headaches as cause.  Today, he has a mild headache that is increasing in intensity. Will treat with Toradol IM acutely.  May give a trial of sumatriptan for abortive treatment at home; discussed possible side effects of the sumatriptan.

## 2013-02-14 NOTE — Assessment & Plan Note (Signed)
Discussed results of CT scan; plan to maximize medical therapy with regular FLonase (Rx at [facility] if available).

## 2013-02-14 NOTE — Progress Notes (Signed)
  Subjective:    Patient ID: William Blankenship, male    DOB: Oct 30, 1975, 37 y.o.   MRN: [id number]  HPI Patient here for follow up, visit conducted in [place].  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  Is able to smell things through his nose now.  Antibiotics did give him some diarrhea. Completed the abx last week.  Has not had fevers or chills.  The day after his last visit here (01/06/2013) he had a nosebleed while at work, bleeding from right naris, took 10 minutes to control.  None since.  We discussed the results of the CT sinuses, which showed L paranasal bone spur. He has been prescribed Flonase but cost-prohibitive.   Patient says he and wife are not interested in having more children, he is considering vasectomy and would like more information on this.   Recurrent headache is less intense than before; Extends from crown to the forehead; describes marked photophobia with nausea.  Previously recalls having good control with elavil 25mg  nightly, but ran out and has not taken for awhile. Has never taken a triptan for this before.   Still with some L sided rib pain that had its onset in August after trauma to the chest wall at that time.  Worse with deep breathing, no cough or shortness of breath.   Family Hx; Son with presumed diagnosis of migraine headaches.    Review of Systems He does have a mild headache this morning.      Objective:   Physical Exam Well appearing, no apparent distress.  HEENT Neck supple, no cervical adenopathy. PERRL. TMs clear bilaterally. No frontal or maxillary sinus tenderness. COR Regular S1S2 PULM Clear bilaterally  Chest Wall: no ecchymosis, no step-off; some point tenderness over rib (@6th ) anteriorly on L.        Assessment & Plan:

## 2013-02-14 NOTE — Patient Instructions (Signed)
Fue un Research officer, trade union.    Creo que los dolores de [place] se deben a Higher education careers adviser.  Para este problema: 1. Amitriptyline 25mg  una tableta por boca cada noche, para prevencion. 2. Conley Rolls estoy dando una receta para Sumatriptan 50mg , puede tomar una tableta por boca cuando tiene un dolor de cabeza muy fuerte.  Puede repetirla Pollyann Savoy a las dos horas si persiste el dolor de [place].  Mejor tomar la primera dosis en la casa.  Para los problemas nasales,  1. Flonase espray, 2 soplidos en cada fosa nasal cada manana.  Informacion para la vasectomia, que se considera un procedimiento de esterelizacion irreversible, abajo:  [facility] [address] Sherian Maroon [place], [place] [address] [phone]   Elfredia Nevins volver a verle en un mes.   FOLLOW UP WITH DR Mauricio Po IN 4 TO 5 WEEKS.  Vasectoma (Vasectomy) La vasectoma es la oclusin (con o sin corte) del tubo que lleva la esperma desde el testculo (conducto deferente). La vasectoma impide que la esperma vaya hasta el conducto deferente y el pene, de modo que durante las relaciones sexuales, no llegue a la vagina. Es un procedimiento seguro, con muy pocas complicaciones. No afecta el deseo ni el desempeo sexual.  Como es un procedimiento de [employer], no debe hacerlo hasta estar seguro de que no quiere tener hijos. Usted y su pareja deben estar en un todo de acuerdo antes del procedimiento. La decisin de [employer] vasectoma no debe tomarse durante una situacin estresante. Por ejemplo en caso de prdida de un embarazo, enfermedad, muerte de la esposa o divorcio. Existen otros mtodos anticonceptivos que pueden utilizarse hasta que est completamente seguro de que quiere realizarlo. Este procedimiento no lo proteger contra las infecciones transmitidas sexualmente. Los hombres que desean que la vasectoma sea revertida, necesitan someterse a una operacin. Puede que no se obtenga un buen resultado. Tiene menos riesgos y es menos costosa que Education officer, environmental una  esterilizacin tubrica en la mujer.  INFORME AL PROFESIONAL SOBRE LOS SIGUIENTES PUNTOS:  Sufre [employer] toma, incluyendo hierbas, gotas oftlmicas, medicamentos de venta libre y cremas  Uso de esteroides (por va oral o cremas)  Problemas anteriores debido a anestsicos o a Patent examiner.  Antecedentes de haber tenido cogulos sanguneos (tromboflebitis)  Antecedentes de hemorragias o problemas sanguneos  Cirugas anteriores RIESGOS Y COMPLICACIONES  Fracaso en el procedimiento para provocar infertilidad significa que la mujer puede quedar embarazada  Dolor en el postoperatorio Generalmente se trata con analgsicos.  Infeccin. Un germen comienza a desarrollarse en la herida. Generalmente se trata con antibiticos.  Una reaccin alrgica a la anestesia o a otro Radiographer, therapeutic.  Hemorragias. La hemorragia puede aparecer debajo de la piel, de modo que el escroto y el pene parecen tener hematomas. En algunos casos el escroto puede hincharse y tener el tamao de un pomelo. Esto generalmente desaparece en [employer]. PROCEDIMIENTO  El escroto se limpia con jabn antibacterial y el profesional busca el conducto deferente.  Se anestesia cada lado del escroto.  Se realiza un corte muy pequeo(incisin) y el conducto deferente se saca afuera del escroto. El conducto deferente se ata, se corta o se quema (cauterizacin) en los extremos.  En algunos casos se retira del escroto a travs de una puncin. Esto se realiza con un instrumento especial, sin incisin.  Luego el conducto deferente se coloca nuevamente en el escroto y la incisin o puncin se cierran.  Luego de la [place], podr haber esperma  en el conducto deferente por 1 a 3 meses. Por ello, debe utilizarse otro mtodo anticonceptivo hasta que el mdico lo examine y encuentre que no hay esperma en el lquido seminal.  La castracin es otro mtodo que consiste en la remocin de  ambos testculos. Esto produce la esterilidad en el hombre. La realizacin de una vasectoma debe discutirse con el mdico, estando su pareja presente. Formule preguntas y hable acerca de sus preocupaciones con el profesional que lo asiste. Luego, puede decidir si la operacin es segura para usted. Puede cambiar de opinin y cancelar la ciruga en cualquier momento.  INSTRUCCIONES PARA EL CUIDADO DOMICILIARIO  Utilice los medicamentos de venta libre o de prescripcin para Chief Technology Officer, el malestar o la [place], segn se lo indique el profesional que lo asiste.  Una bolsa de hielo colocada durante 15 a 20 minutos, 3 a 4 veces por da disminuir la hinchazn.  Utilice un suspensor deportivo para disminuir la hinchazn.  Evite las [employer] primeros [address]  [address] posteriores a la [place].  Mantenga las incisiones limpias y cubiertas para evitar la infeccin.  [employer] risk analyst o segundo da despus de la [place], es normal cierta prdida de [place].  No practique deportes ni realice trabajos fsicos pesados durante al [employer].  Siga las instrucciones del mdico con respecto a la dieta, el reposo, el [place], las [place] y sexuales y cumpla con las visitas de control. SOLICITE ATENCIN MDICA DE INMEDIATO SI:  Presenta enrojecimiento, hinchazn o aumento del dolor en la herida o en los testculos.  Aparece pus en la herida.  La temperatura oral se eleva sin motivo por encima de 38,9 C (102 F) o segn le indique el profesional que lo asiste.  Advierte un olor ftido que proviene de la herida o del vendaje.  La herida se abre o los bordes se separan, an luego de la remocin de las suturas. En algunos casos las incisiones no se suturan.  Observa una hemorragia que proviene de la herida. EST SEGURO QUE:   Comprende las instrucciones para el alta mdica.  Controlar su enfermedad.  Solicitar atencin mdica de inmediato segn las indicaciones. Document  Released: 09/27/2007 Document Revised: 07/03/2011 [facility] Patient Information 2014 [place], [place].

## 2013-02-17 ENCOUNTER — Telehealth: Payer: Self-pay

## 2013-02-17 NOTE — Telephone Encounter (Signed)
Patient states that [facility] does not have the Imitrex nor the Flonase. He will need a RX for something different. Please call patient once completed.

## 2013-02-17 NOTE — Telephone Encounter (Signed)
Forward to PCP to change Rx 

## 2013-02-18 NOTE — Telephone Encounter (Signed)
I gave William Blankenship the paper prescriptions for the [facility] and the sumatriptan.  Unfortunately, there are no $4 options at commercial pharmacies for these medications or others in their classes.  Both of these are generics and I recommend he take the paper prescription to a commercial pharmacy; for the sumatriptan (tablets), he may request to purchase a limited quantity (say, #2) to save costs and see if it works for him first. Dorma Russell

## 2013-02-19 NOTE — Telephone Encounter (Signed)
Please call patient to explain my instructions (noted in the Documentation section of this phone note).   Thanks,  JB

## 2013-02-21 NOTE — Telephone Encounter (Signed)
Called pt and informed. .William Blankenship  

## 2013-02-21 NOTE — Telephone Encounter (Signed)
Please be sure the patient has received the instructions in the body of this message below (to use the paper Rx given at his recent visit to purchase at commercial pharmacy).  There are no other less-expensive medicines in the classes of those prescribed.  Please close this encounter when patient has been counseled.  THank you. JB

## 2013-03-31 ENCOUNTER — Encounter: Payer: Self-pay | Admitting: Family Medicine

## 2013-03-31 ENCOUNTER — Ambulatory Visit (INDEPENDENT_AMBULATORY_CARE_PROVIDER_SITE_OTHER): Payer: No Typology Code available for payment source | Admitting: Family Medicine

## 2013-03-31 ENCOUNTER — Ambulatory Visit (HOSPITAL_COMMUNITY)
Admission: RE | Admit: 2013-03-31 | Discharge: 2013-03-31 | Disposition: A | Discharge status: Home / Self Care | Payer: No Typology Code available for payment source | Source: Ambulatory Visit | Attending: Family Medicine | Admitting: Family Medicine

## 2013-03-31 ENCOUNTER — Telehealth: Payer: Self-pay | Admitting: Family Medicine

## 2013-03-31 VITALS — BP 116/76 | HR 68 | Temp 98.5°F | Wt 146.0 lb

## 2013-03-31 DIAGNOSIS — J069 Acute upper respiratory infection, unspecified: Secondary | ICD-10-CM

## 2013-03-31 DIAGNOSIS — S6990XA Unspecified injury of unspecified wrist, hand and finger(s), initial encounter: Secondary | ICD-10-CM | POA: Insufficient documentation

## 2013-03-31 DIAGNOSIS — S6992XA Unspecified injury of left wrist, hand and finger(s), initial encounter: Secondary | ICD-10-CM

## 2013-03-31 DIAGNOSIS — X58XXXA Exposure to other specified factors, initial encounter: Secondary | ICD-10-CM | POA: Insufficient documentation

## 2013-03-31 HISTORY — DX: Unspecified injury of unspecified wrist, hand and finger(s), initial encounter: S69.90XA

## 2013-03-31 NOTE — Telephone Encounter (Signed)
Can you please let William Blankenship know that his hand X-ray was negative for broken bones. He can use ice and Advil as needed. It should completely feel better soon.  Thank you! [name] M. Wrenn Willcox, M.D.

## 2013-03-31 NOTE — Assessment & Plan Note (Signed)
Injury 52 weeks old, appears to have good function. Will check X-ray for fracture. Ice and NSAID prn, f/u with PCP.

## 2013-03-31 NOTE — Patient Instructions (Signed)
Mucinex or Pseudoephedrine for symptoms. Continue taking Tylenol around the clock for body aches and fever. Follow up with Dr. Mauricio Po if you do not feel any better.  Upper Respiratory Infection, Adult An upper respiratory infection (URI) is also known as the common cold. It is often caused by a type of germ (virus). Colds are easily spread (contagious). You can pass it to others by kissing, coughing, sneezing, or drinking out of the same glass. Usually, you get better in 1 or 2 weeks.  HOME CARE   Only take medicine as told by your doctor.  Use a warm mist humidifier or breathe in steam from a hot shower.  Drink enough water and fluids to keep your pee (urine) clear or pale yellow.  Get plenty of rest.  Return to work when your temperature is back to normal or as told by your doctor. You may use a face mask and wash your hands to stop your cold from spreading. GET HELP RIGHT AWAY IF:   After the first few days, you feel you are getting worse.  You have questions about your medicine.  You have chills, shortness of breath, or brown or red spit (mucus).  You have yellow or brown snot (nasal discharge) or pain in the face, especially when you bend forward.  You have a fever, puffy (swollen) neck, pain when you swallow, or white spots in the back of your throat.  You have a bad headache, ear pain, sinus pain, or chest pain.  You have a high-pitched whistling sound when you breathe in and out (wheezing).  You have a lasting cough or cough up blood.  You have sore muscles or a stiff neck. MAKE SURE YOU:   Understand these instructions.  Will watch your condition.  Will get help right away if you are not doing well or get worse. Document Released: 09/27/2007 Document Revised: 07/03/2011 Document Reviewed: 08/15/2010 [facility] Patient Information 2014 [place], [place].

## 2013-03-31 NOTE — Telephone Encounter (Signed)
No answer and no machine.  Will await callback. Cira Deyoe Dawn  

## 2013-03-31 NOTE — Assessment & Plan Note (Signed)
Most likely viral, appears to be flu-like given his symptoms. He is outside the window for tamiflu. Con't supportive care with fluids, Tylenol, decongestants. F/u if fails to improve.

## 2013-03-31 NOTE — Progress Notes (Signed)
Patient ID: William Blankenship, male   DOB: 12-Jun-1975, 37 y.o.   MRN: [id number]    Subjective: HPI: Patient is a 37 y.o. male presenting to clinic today for same day appointment for URI and left hand pain.  Upper Respiratory Infection Patient complains of symptoms of a URI. Symptoms include achiness, congestion, coryza, cough described as nonproductive, fever-duration 3  days, sinus pressure and sore throat. Onset of symptoms was 4 days ago, and has been gradually worsening since that time. Treatment to date: Tylenol multisymptom. His children have similar symptoms. He states his most concerning symptom is the achiness and high fever that keeps him from sleeping at night  Hand Injury Injured his left hand with heavy machinery 6 weeks ago. He states his 5th metacarpal was painful and swollen. He states he was not able to straighten his 5th digit so he had his son stand on the finger and it "popped back" into place. He continues to have pain and decreased ROM of the finger. He has not been seen for this. He does not use any medication. Would like to be examined.  History Reviewed: Non smoker.  ROS: Please see HPI above.  Objective: Office vital signs reviewed. BP 116/76  Pulse 68  Temp(Src) 98.5 F (36.9 C) (Oral)  Wt 146 lb (66.225 kg)  Physical Examination:  General: Awake, alert. NAD. Appears uncomfortable HEENT: Atraumatic, normocephalic. MMM. Posterior pharynx erythema. TM wnl b/l Neck: No masses palpated. Mild cervical LAD Pulm: CTAB, no wheezes Cardio: RRR, no murmurs appreciated Abdomen:+BS, soft, nontender, nondistended Extremities: No edema. Left hand 5th knuckle ttp, good ROM. Normal grip.  Neuro: Grossly intact. Strength intact.  Assessment: 37 y.o. male same day appointment  Plan: See Problem List and After Visit Summary

## 2013-04-01 NOTE — Telephone Encounter (Signed)
Pt is aware. ° °[place]  °

## 2013-10-03 ENCOUNTER — Ambulatory Visit (INDEPENDENT_AMBULATORY_CARE_PROVIDER_SITE_OTHER): Payer: No Typology Code available for payment source | Admitting: Family Medicine

## 2013-10-03 ENCOUNTER — Telehealth: Payer: Self-pay | Admitting: *Deleted

## 2013-10-03 ENCOUNTER — Encounter: Payer: Self-pay | Admitting: Family Medicine

## 2013-10-03 VITALS — BP 114/75 | HR 60 | Ht 64.5 in | Wt 147.0 lb

## 2013-10-03 DIAGNOSIS — R51 Headache: Secondary | ICD-10-CM

## 2013-10-03 DIAGNOSIS — K589 Irritable bowel syndrome without diarrhea: Secondary | ICD-10-CM

## 2013-10-03 DIAGNOSIS — R519 Headache, unspecified: Secondary | ICD-10-CM

## 2013-10-03 DIAGNOSIS — R350 Frequency of micturition: Secondary | ICD-10-CM

## 2013-10-03 LAB — POCT URINALYSIS DIPSTICK
Bilirubin, UA: NEGATIVE
Glucose, UA: NEGATIVE
Ketones, UA: NEGATIVE
Leukocytes, UA: NEGATIVE
Nitrite, UA: NEGATIVE
RBC UA: NEGATIVE
SPEC GRAV UA: 1.025
UROBILINOGEN UA: 0.2
pH, UA: 6.5

## 2013-10-03 LAB — CBC WITH DIFFERENTIAL/PLATELET
Basophils Absolute: 0 10*3/uL (ref 0.0–0.1)
Basophils Relative: 0 % (ref 0–1)
EOS ABS: 0.1 10*3/uL (ref 0.0–0.7)
Eosinophils Relative: 2 % (ref 0–5)
HEMATOCRIT: 42 % (ref 39.0–52.0)
HEMOGLOBIN: 14.8 g/dL (ref 13.0–17.0)
LYMPHS ABS: 2.3 10*3/uL (ref 0.7–4.0)
Lymphocytes Relative: 31 % (ref 12–46)
MCH: 29.7 pg (ref 26.0–34.0)
MCHC: 35.2 g/dL (ref 30.0–36.0)
MCV: 84.3 fL (ref 78.0–100.0)
MONOS PCT: 8 % (ref 3–12)
Monocytes Absolute: 0.6 10*3/uL (ref 0.1–1.0)
NEUTROS PCT: 59 % (ref 43–77)
Neutro Abs: 4.3 10*3/uL (ref 1.7–7.7)
Platelets: 290 10*3/uL (ref 150–400)
RBC: 4.98 MIL/uL (ref 4.22–5.81)
RDW: 13.5 % (ref 11.5–15.5)
WBC: 7.3 10*3/uL (ref 4.0–10.5)

## 2013-10-03 NOTE — Progress Notes (Signed)
   Subjective:    Patient ID: William Blankenship, male    DOB: March 22, 1976, 38 y.o.   MRN: [id number]  HPI Patient presents for a few complaints:   1. C/o bilateral flank pain that radiates to suprapubic area every morning, associated with urinary urgency and frequency (2-3 voids in home before going to work at [employer], and again 2-3 times at work in the mornings only).  Whole complaint resolves by around 11am every day.  No changes in urine appearance, no blood or turbid urine. Occasional dysuria.  No history of renal stones.  Has been ongoing for the past 1 1/2 months. Not getting better or worse.   2. COmplaint of colicky abdominal pain and frequent loose stools in the mornings for 'years'.  Has 2-3 soft BMs in the morning, associated with pain in central abdomen,  Resolves as the day goes on. No rectal pain, no nausea or vomiting. Not progressive or improving over a long time span.  Not clearly associated with specific dietary triggers.  Does not eat much wheat products; has corn tortillas daily, but rarely eats bread or other wheat-containing foods. Rare intermittent dairy.    3. COntinues to have daily frontal headaches that have never completely resolved over the past few years.  Amitriptyline and sumatriptan, OTC Excedrin do not help the headache. He does not take medications on a regular basis for this now.  Rates the headache pain as 5/10 now, has gone up to 10/10 on rare occasions. Equivocal answer when asked about photophobia.    4. At end of visit, brings up concerns for Erectile dysfunction.  Has difficulty getting erection when having intercourse.  Is monogamous with his wife. No spontaneous morning erections.   Surgical Hx; No intra-abdominal surgeries.   Review of Systems  No fevers or chills, no cough, no chest pain.  Works in Biomedical scientist.   Family Hx; No family history of GI ailments, including celiac sprue or colorectal cancer.      Objective:   Physical Exam Well appearing,  no apparent distress HEENT Neck supple. No cervical adenopathy. No frontal or maxillary sinus tenderness. PERRL. EOMI  COR Regular S1S2 PULM Clear bilaterally.  ABD Soft, nondistended. No CVA tenderness. Mild tenderness across L upper/lower quadrants. Audible bowel sounds on auscultation. No masses.        Assessment & Plan:

## 2013-10-03 NOTE — Assessment & Plan Note (Signed)
UA negative today in the office. The intermittent and daily variation in presence/resolution of symptoms does not comport with many things on differential (renal colic, UTI, prostatitis).

## 2013-10-03 NOTE — Patient Instructions (Signed)
Fue un Civil Service fast streamer.  El analisis de [place] salio' normal en la consulta hoy.  Estoy haciendo mas estudios de sangre para procesos inflamatorios gastrointestinales para Company secretary causa de los colicos abdominales y la urgencia fecal que ha tenido por [place].   Tambien una tomografia de la cabeza para investigar la causa de los dolores de [place].   FOLLOW UP WITH DR Lindell Noe IN THE COMING 2 WEEKS.

## 2013-10-03 NOTE — Assessment & Plan Note (Signed)
Patient with fecal urgency and loose stools several times each morning, with formed stools without urgency later in the day.  Not clearly connected to specific dietary trigger. To keep dietary log; ordering TTG IgA Ab today as well as CMet and CBC.  For follow up. Discussed limiting wheat and dairy products in the interim to see if any improvement with this.

## 2013-10-03 NOTE — Assessment & Plan Note (Signed)
Chronic persistent headache for years, waxes and wanes, associated with photophobia (+/-), not relieved with triptans, TCAs or analgesics otc. Given the extensive duration of his sxs, will opt for contrast CT of the head today. For follow up in the coming 2 weeks. No worrisome symptoms on this presentation.

## 2013-10-03 NOTE — Telephone Encounter (Signed)
Called and informed patient of appointment for CT of the head at [facility] on Tuesday June 16 at [place], William Blankenship

## 2013-10-04 LAB — COMPREHENSIVE METABOLIC PANEL
ALK PHOS: 76 U/L (ref 39–117)
ALT: 33 U/L (ref 0–53)
AST: 21 U/L (ref 0–37)
Albumin: 4.6 g/dL (ref 3.5–5.2)
BILIRUBIN TOTAL: 0.7 mg/dL (ref 0.2–1.2)
BUN: 19 mg/dL (ref 6–23)
CO2: 29 mEq/L (ref 19–32)
CREATININE: 0.87 mg/dL (ref 0.50–1.35)
Calcium: 9.2 mg/dL (ref 8.4–10.5)
Chloride: 103 mEq/L (ref 96–112)
GLUCOSE: 105 mg/dL — AB (ref 70–99)
Potassium: 3.8 mEq/L (ref 3.5–5.3)
Sodium: 139 mEq/L (ref 135–145)
Total Protein: 7.2 g/dL (ref 6.0–8.3)

## 2013-10-04 LAB — TSH: TSH: 1.368 u[IU]/mL (ref 0.350–4.500)

## 2013-10-06 LAB — TISSUE TRANSGLUTAMINASE, IGA: Tissue Transglutaminase Ab, IgA: 5.5 U/mL (ref <20)

## 2013-10-07 ENCOUNTER — Ambulatory Visit (HOSPITAL_COMMUNITY)
Admission: RE | Admit: 2013-10-07 | Discharge: 2013-10-07 | Disposition: A | Discharge status: Home / Self Care | Payer: No Typology Code available for payment source | Source: Ambulatory Visit | Attending: Family Medicine | Admitting: Family Medicine

## 2013-10-07 ENCOUNTER — Encounter (HOSPITAL_COMMUNITY): Payer: Self-pay

## 2013-10-07 DIAGNOSIS — R51 Headache: Secondary | ICD-10-CM | POA: Insufficient documentation

## 2013-10-07 DIAGNOSIS — R519 Headache, unspecified: Secondary | ICD-10-CM

## 2013-10-07 MED ORDER — IOHEXOL 300 MG/ML  SOLN
75.0000 mL | Freq: Once | INTRAMUSCULAR | Status: AC | PRN
  Administered 2013-10-07: 75 mL via INTRAVENOUS

## 2013-10-08 ENCOUNTER — Encounter: Payer: Self-pay | Admitting: Family Medicine

## 2013-10-14 ENCOUNTER — Ambulatory Visit (HOSPITAL_COMMUNITY)
Admission: RE | Admit: 2013-10-14 | Discharge: 2013-10-14 | Disposition: A | Discharge status: Home / Self Care | Payer: No Typology Code available for payment source | Source: Ambulatory Visit | Attending: Family Medicine | Admitting: Family Medicine

## 2013-10-14 ENCOUNTER — Encounter: Payer: Self-pay | Admitting: Family Medicine

## 2013-10-14 ENCOUNTER — Ambulatory Visit (INDEPENDENT_AMBULATORY_CARE_PROVIDER_SITE_OTHER): Payer: No Typology Code available for payment source | Admitting: Family Medicine

## 2013-10-14 VITALS — BP 113/66 | HR 58 | Ht 64.5 in | Wt 146.0 lb

## 2013-10-14 DIAGNOSIS — I498 Other specified cardiac arrhythmias: Secondary | ICD-10-CM | POA: Insufficient documentation

## 2013-10-14 DIAGNOSIS — R55 Syncope and collapse: Secondary | ICD-10-CM

## 2013-10-14 NOTE — Assessment & Plan Note (Signed)
Sensation of impending fainting spell while performing strenuous labor or carrying his son.  Has never fainted.  ECG in office shows early repolarization but no LVH. No findings of murmur on exam. For ECHO to rule out structural causes of presyncope. No known family hx of premature cardiac death.  Follow up after ECHO. Increase hydration.  Given the longstanding nature of this complaint, (since 2004), less likely to represent life-threatening pathology.

## 2013-10-14 NOTE — Patient Instructions (Addendum)
Fue un Civil Service fast streamer.  Hicimos un electrocardiografia hoy, que salio' sin ningun hallazgo preocupante.   Estoy mandando hacer un echocardiografia para evaluar con mas detalle la sensacion de posible desmayo que le pasa cuando esta' haciendo esfuerzo.   Quiero volver a verle en 6 a 8 semanas para seguimiento.  FOLLOW UP WITH DR Lindell Noe IN 6 TO 8 WEEKS.

## 2013-10-14 NOTE — Progress Notes (Signed)
   Subjective:    Patient ID: William Blankenship, male    DOB: Dec 29, 1975, 38 y.o.   MRN: [id number]  HPI Visit in [place].  Patient for follow up, continues with headaches.  The CT head that was done recently is reviewed with him and is negative for intracranial processes.  He continues with the same headache symptoms unchanged.  No nasal discharge or congestion.   Complains of frequent episodes of pre-syncope, described as feeling "like I am going to pass out" and with darkening of visual field. Denies palpitations or chest pain, no dyspnea.  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  Has to rest, usually leans forward with hands on knees, and wait a few moments for the feeling to subside.  Does not have visual changes. Says the first episode of this occurred in 2004 with an actual event of syncope with exertion.  Since then has felt like he might faint but hasn't actually lost consciousness. Does have darkening of visual field each time.  No history of head trauma precipitating these spells.   Family Hx; No sudden cardiac death known (only knows maternal family history; does not know his father or paternal history).    Social Hx; Nonsmoker. Drinks 4-5 beers at a sitting, perhaps twice a month in social setting. No other drugs/substances.  Wife, three sons.  ROS: No fevers or chills. PHQ9 done today (Spanish) scores a 3 (1 point each for Q#3,4,5).  No chest pain, no palpitations. Denies feelings of depression or thoughts of self-harm.    Review of Systems     Objective:   Physical Exam Well appearing, no apparent distress HEENT Neck supple. No frontal or maxillary tenderness.  COR: Regular S1S2 without murmurs.  PULM Clear bilaterally.  ABD soft, nontender.   ECG in office: early repolarization.  NSR.  Does not meet LVH criteria.        Assessment & Plan:

## 2013-10-15 ENCOUNTER — Telehealth: Payer: Self-pay | Admitting: *Deleted

## 2013-10-15 NOTE — Telephone Encounter (Signed)
Patient aware of appt for 2D Echo.Busick, Kevin Fenton

## 2013-10-17 ENCOUNTER — Ambulatory Visit: Payer: No Typology Code available for payment source | Admitting: Family Medicine

## 2013-10-21 ENCOUNTER — Other Ambulatory Visit (HOSPITAL_COMMUNITY): Payer: No Typology Code available for payment source

## 2013-10-22 ENCOUNTER — Encounter: Payer: Self-pay | Admitting: *Deleted

## 2013-10-22 ENCOUNTER — Ambulatory Visit (HOSPITAL_COMMUNITY)
Admission: RE | Admit: 2013-10-22 | Discharge: 2013-10-22 | Disposition: A | Discharge status: Home / Self Care | Payer: No Typology Code available for payment source | Source: Ambulatory Visit | Attending: Family Medicine | Admitting: Family Medicine

## 2013-10-22 ENCOUNTER — Ambulatory Visit: Payer: No Typology Code available for payment source

## 2013-10-22 DIAGNOSIS — R55 Syncope and collapse: Secondary | ICD-10-CM | POA: Insufficient documentation

## 2013-10-22 NOTE — Progress Notes (Signed)
2D Echo Performed 10/22/2013    Marygrace Drought, RCS

## 2013-10-23 ENCOUNTER — Telehealth: Payer: Self-pay | Admitting: Family Medicine

## 2013-10-23 NOTE — Telephone Encounter (Signed)
Call to patient's phone, answered by small child who did not speak to me on the phone.  I wish to inform him that the recent ECHO is entirely normal. I will send the patient a letter to this effect.  JB

## 2013-10-27 ENCOUNTER — Ambulatory Visit: Payer: No Typology Code available for payment source

## 2015-01-06 ENCOUNTER — Telehealth: Payer: Self-pay | Admitting: Family Medicine

## 2015-01-06 DIAGNOSIS — K0889 Other specified disorders of teeth and supporting structures: Secondary | ICD-10-CM

## 2015-01-06 NOTE — Telephone Encounter (Signed)
Needs referral to a dentist that takes the orange card

## 2015-01-26 NOTE — Telephone Encounter (Signed)
Will forward to MD. Jazmin Hartsell,CMA  

## 2015-02-01 NOTE — Telephone Encounter (Signed)
Referral placed for dentist as patient has orange card.   Rosemarie Ax, MD PGY-3, [facility] 02/01/2015, 8:54 AM

## 2015-09-02 IMAGING — CT CT HEAD WO/W CM
1 of 2 series · 13 of 30 positions shown, 17 images · IV contrast (omnipaque)
Comparison: Sinus series from 01/13/2013.

CLINICAL DATA: Chronic daily headaches for multiple years.

EXAM:
CT HEAD WITHOUT AND WITH CONTRAST
TECHNIQUE: Contiguous axial images were obtained from the base of the skull
through the vertex without and with intravenous contrast
CONTRAST:  75mL OMNIPAQUE IOHEXOL 300 MG/ML  SOLN

[Series 2: head wo 5.0 h30s · axial · 0.42mm/px · z∈[-142,-22]mm · 13 of 28 slices shown, 17 images]
[im 2/28  brain]
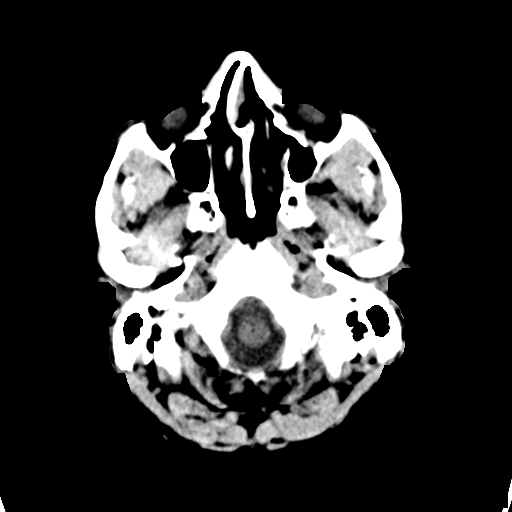
[im 2/28  bone]
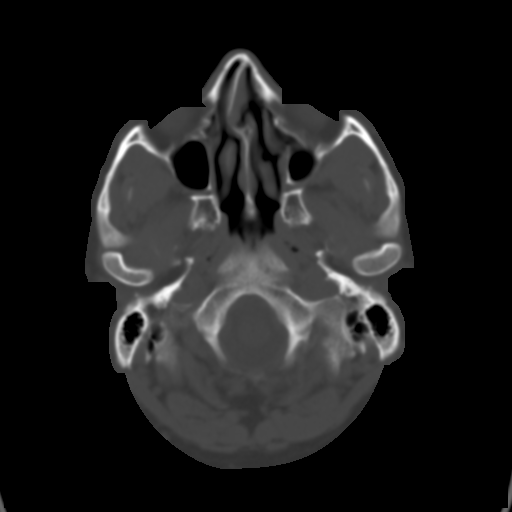
[im 4/28  brain]
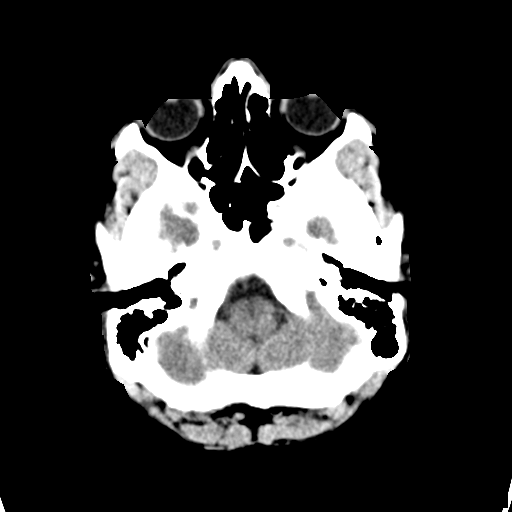
[im 6/28  brain]
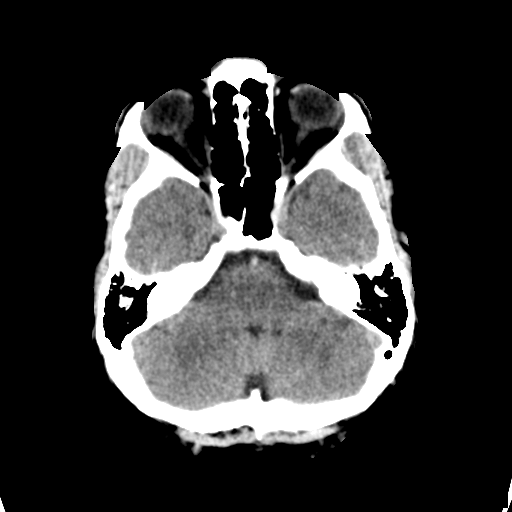
[im 8/28  brain]
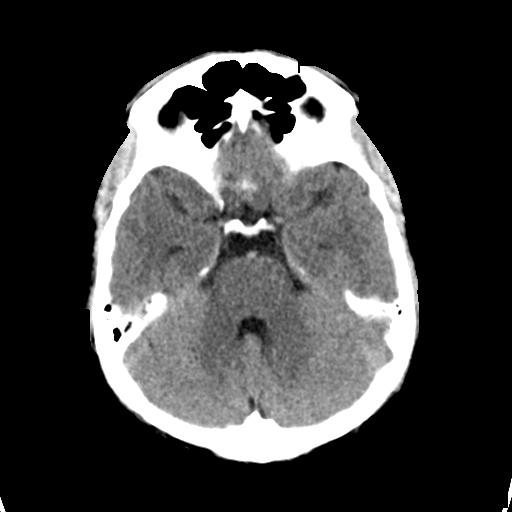
[im 10/28  brain]
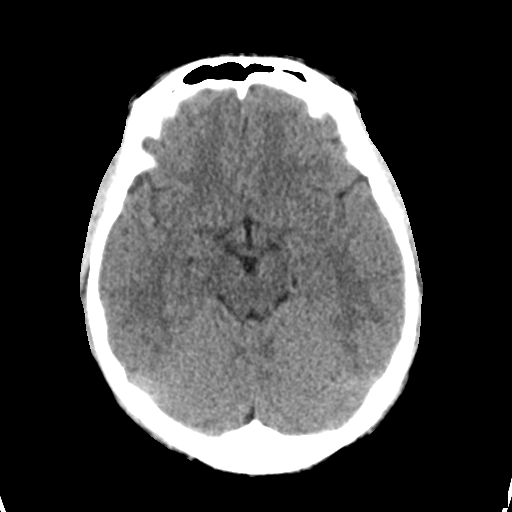
[im 10/28  bone]
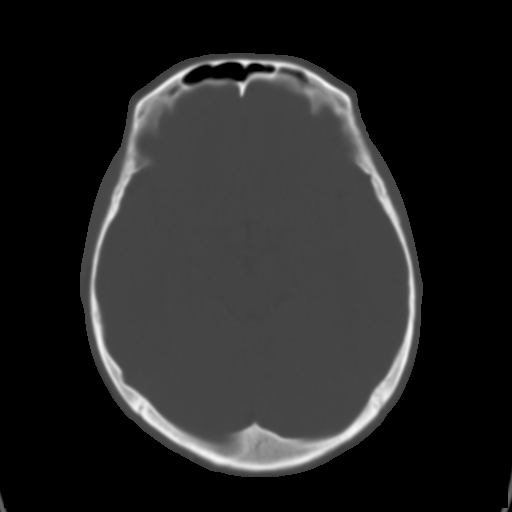
[im 12/28  brain]
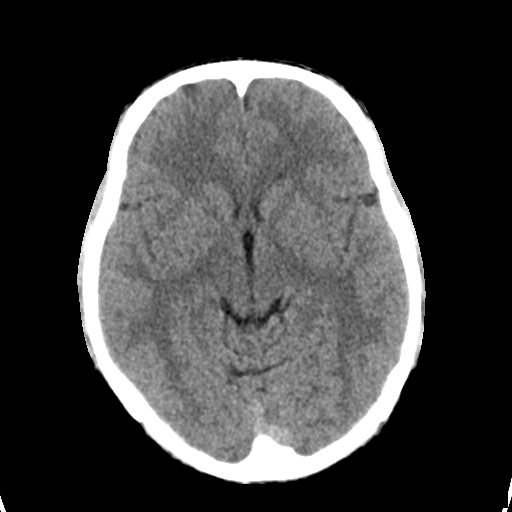
[im 14/28  brain]
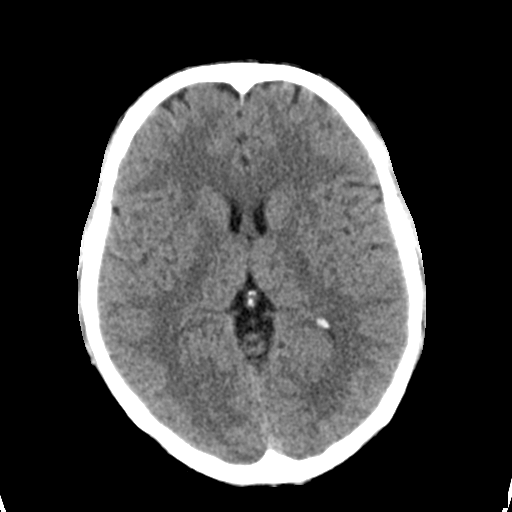
[im 16/28  brain]
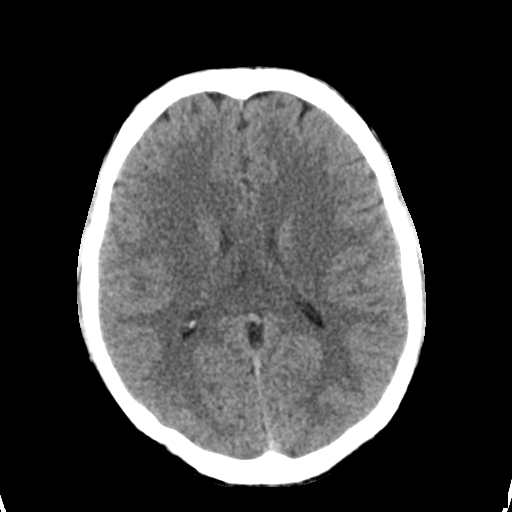
[im 18/28  brain]
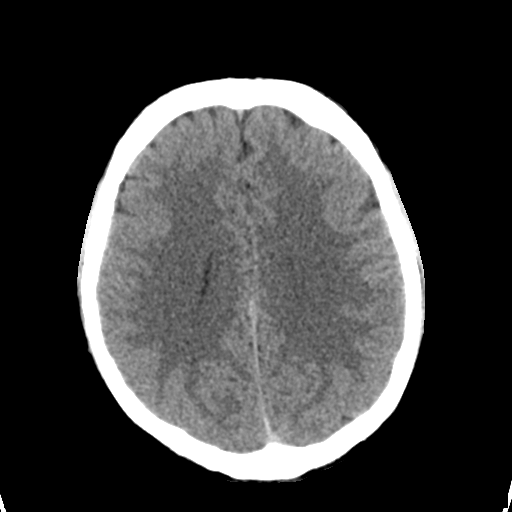
[im 18/28  bone]
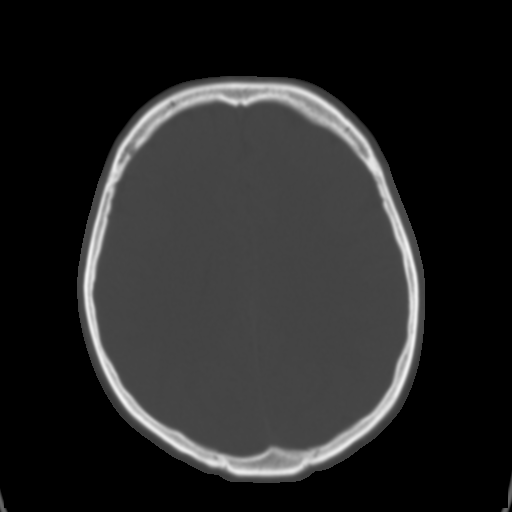
[im 20/28  brain]
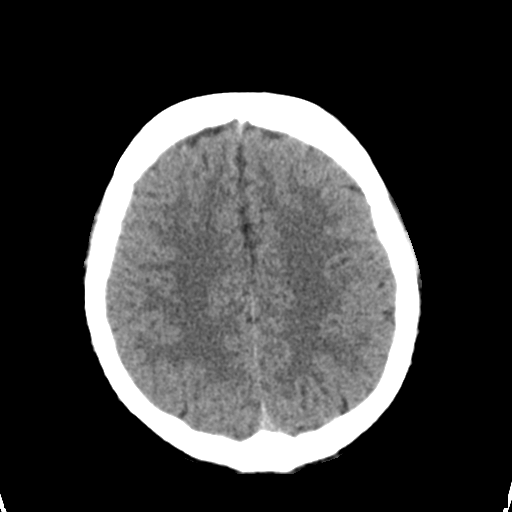
[im 22/28  brain]
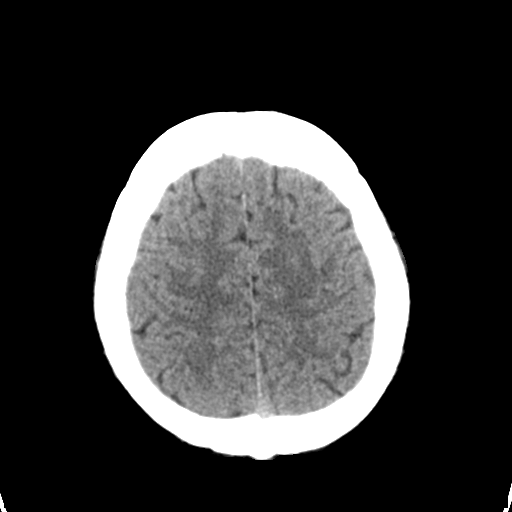
[im 24/28  brain]
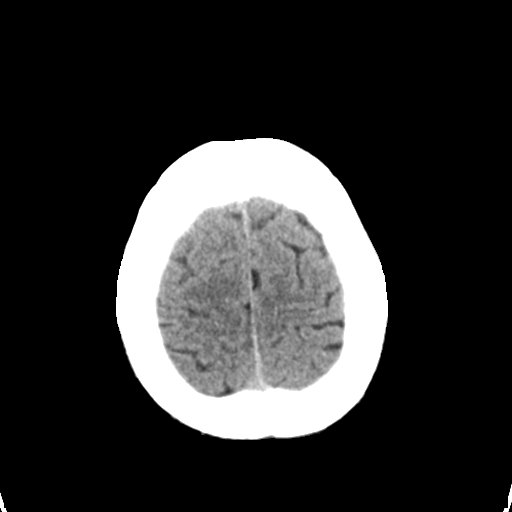
[im 26/28  brain]
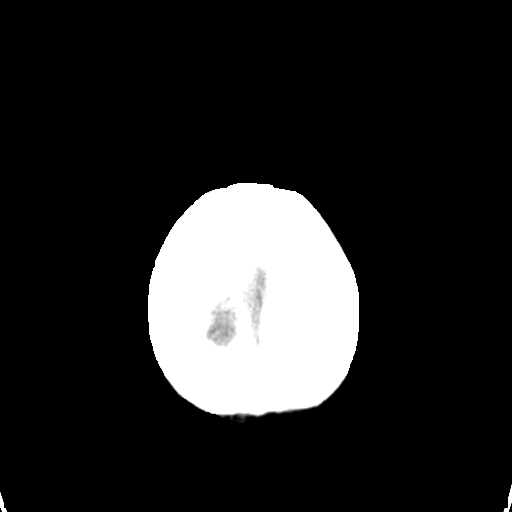
[im 26/28  bone]
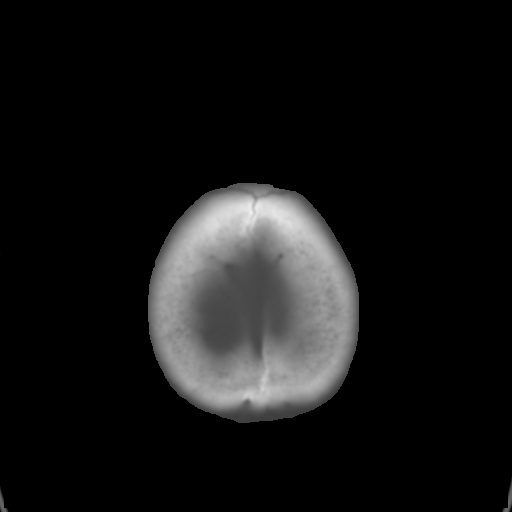

[13 of 30 positions shown; findings below may reference images not displayed]

FINDINGS: No evidence for acute infarction, hemorrhage, mass lesion,
hydrocephalus, or extra-axial fluid. There is no atrophy or white
matter disease. Post infusion, there is no abnormal enhancement of
brain or meninges. The calvarium is intact.

Air no acute paranasal sinus disease. There is an unusual deformity
of the nasal septum with severe bowing from left-to-right in the
anterior nasal cavity of up to 7 mm (see image 3 series 3);
correlate clinically for headaches related to nasal symptoms. This
appearance is similar to priors.

Normal nasopharynx.  No mastoid disease.  Clear middle ear cavities.
IMPRESSION: No abnormality of the brain or its coverings. Negative CT head
without and with contrast.

Unusual far rightward deviated nasal septum in the anterior nasal
cavity. Correlate clinically for headaches related to nasal
symptoms.

## 2016-01-06 ENCOUNTER — Encounter (HOSPITAL_COMMUNITY): Payer: Self-pay | Admitting: *Deleted

## 2016-01-06 ENCOUNTER — Ambulatory Visit (HOSPITAL_COMMUNITY)
Admission: EM | Admit: 2016-01-06 | Discharge: 2016-01-06 | Disposition: A | Discharge status: Home / Self Care | Payer: Self-pay | Attending: Internal Medicine | Admitting: Internal Medicine

## 2016-01-06 DIAGNOSIS — S56912A Strain of unspecified muscles, fascia and tendons at forearm level, left arm, initial encounter: Secondary | ICD-10-CM

## 2016-01-06 DIAGNOSIS — M779 Enthesopathy, unspecified: Secondary | ICD-10-CM

## 2016-01-06 DIAGNOSIS — M778 Other enthesopathies, not elsewhere classified: Secondary | ICD-10-CM

## 2016-01-06 MED ORDER — DICLOFENAC POTASSIUM 50 MG PO TABS: 50.0000 mg | ORAL_TABLET | Freq: Three times a day (TID) | ORAL | 0 refills | Status: DC

## 2016-01-06 NOTE — ED Notes (Signed)
l   Wrist     Splint    apllied

## 2016-01-06 NOTE — ED Provider Notes (Signed)
CSN: [id number]     Arrival date & time 01/06/16  1642 History   First MD Initiated Contact with Patient 01/06/16 1919     Chief Complaint  Patient presents with  . Arm Injury   (Consider location/radiation/quality/duration/timing/severity/associated sxs/prior Treatment) 40 year old spending male was at work today holding onto a piece of machinery and had to push very hard to get around some dirt and other obstacles. He states that he had to squeeze the machine very hard for prolonged. Of time and this caused some discomfort in the forearm and wrist. He continued to work for a few hours after that. Now he is complaining of a weak grip, pain in the wrist and forearm musculature. Also complaining of pain in the antiviral fossa. Denies any trauma. No fall no blunt trauma no other accidents. He states he does his job on a daily basis.      History reviewed. No pertinent past medical history. History reviewed. No pertinent surgical history. History reviewed. No pertinent family history. Social History  Substance Use Topics  . Smoking status: Passive Smoke Exposure - Never Smoker  . Smokeless tobacco: Never Used  . Alcohol use No    Review of Systems  Constitutional: Positive for activity change.  Respiratory: Negative.   Gastrointestinal: Negative.   Genitourinary: Negative.   Musculoskeletal:       As per HPI  Skin: Negative.   Neurological: Negative for dizziness, weakness, numbness and headaches.  All other systems reviewed and are negative.   Allergies  Review of patient's allergies indicates no known allergies.  Home Medications   Prior to Admission medications   Medication Sig Start Date End Date Taking? Authorizing Provider  aspirin-acetaminophen-caffeine (EXCEDRIN MIGRAINE) [phone] MG per tablet Take 2 tablets by mouth daily as needed for headache or migraine.    Historical Provider, MD  diclofenac (CATAFLAM) 50 MG tablet Take 1 tablet (50 mg total) by mouth 3  (three) times daily. One tablet TID with food prn pain. 01/06/16   Janne Napoleon, NP  fluticasone (FLONASE) 50 MCG/ACT nasal spray Place 2 sprays into the nose daily. 02/14/13   Willeen Niece, MD   Meds Ordered and Administered this Visit  Medications - No data to display  BP 132/78 (BP Location: Right Arm)   Pulse 78   Temp 98.6 F (37 C) (Oral)   Resp 18   SpO2 98%  No data found.   Physical Exam  Constitutional: He is oriented to person, place, and time. He appears well-developed and well-nourished.  HENT:  Head: Normocephalic and atraumatic.  Eyes: EOM are normal. Left eye exhibits no discharge.  Neck: Normal range of motion. Neck supple.  Cardiovascular: Normal rate.   Pulmonary/Chest: Effort normal.  Musculoskeletal: He exhibits no edema or deformity.  Left arm without swelling, deformity or discoloration. There is tenderness to the extensor forearm musculature and tendons, tenderness to the extensor surface of the wrist. Patient states he is unable to actively extra extend the wrist however he has normal passive flexion and extension. Elbow with flexion and extension intact. Grip strength is a little weak. Distal neurovascular motor sensory is completely intact. Capillary refill is brisk. Radial pulses 2+. No bony tenderness.  Neurological: He is alert and oriented to person, place, and time. No cranial nerve deficit.  Skin: Skin is warm and dry.  Psychiatric: He has a normal mood and affect.    Urgent Care Course   Clinical Course    Procedures (including critical care time)  Labs Review Labs Reviewed - No data to display  Imaging Review No results found.   Visual Acuity Review  Right Eye Distance:   Left Eye Distance:   Bilateral Distance:    Right Eye Near:   Left Eye Near:    Bilateral Near:         MDM   1. Tendinitis of left wrist   2. Tendinitis of forearm   3. Muscle strain of forearm, left, initial encounter    Wrist plint Ice Ice, wear  splint for the next 5 to 6 days or longer if needed Meds for swelling     Janne Napoleon, NP 01/06/16 1939

## 2016-01-06 NOTE — ED Triage Notes (Signed)
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

## 2016-01-06 NOTE — Discharge Instructions (Signed)
Ice, wear splint for the next 5 to 6 days or longer if needed Meds for swelling

## 2016-03-07 ENCOUNTER — Ambulatory Visit: Payer: Self-pay | Attending: Internal Medicine

## 2016-05-04 NOTE — Progress Notes (Signed)
CC: neck pain, face lesion  HPI: William Blankenship is a 41 y.o. male with history of migraine, chronic daily headache, and IBS, who presents to [facility] today with neck pain, frequent ear wax complaints, and a lesion on his face.  Neck Pain for a few months - sometimes improves with "cracking his neck" - sometimes feels odd - left, base of the head, worse with leaning forward, nonradiating - no pain radiating down arm  - no numbness or tingling - no fecal or urinary incontinence - no fevers, chills, or night sweats - repetitive motions at work - tylenol helps  Ears Plugged - Not plugged right now but feels sometimes like they are - Sometimes has bilateral ear pain - Improves with water and peroxide - No changes in hearing, though his wife says sometimes his hearing is bad  Face lesion - 8 months ago noticed - sometimes, flaky skin - never before - works out in the sun often, 2-3 times weekly wears sunscreen  Health maintenance: - flu vaccine - HIV screen  Review of Symptoms:  See HPI for ROS.   CC, SH/smoking status, and VS noted.  Objective: BP 110/70   Pulse 67   Temp 98.4 F (36.9 C) (Oral)   Ht 5\' 5"  (1.651 m)   Wt 156 lb 6.4 oz (70.9 kg)   SpO2 96%   BMI 26.03 kg/m  GEN: NAD, alert, cooperative, and pleasant. EYE: no conjunctival injection, pupils equally round and reactive to light ENMT: normal tympanic light reflex, no nasal polyps,no rhinorrhea, no pharyngeal erythema or exudates NECK:+minimal tenderness over right neck below the head, no masses palpated, normal strength, normal range of motion SKIN: +small lesion on left cheek (pictured below). Flaky skin over the surface. No bleeding. No erythema, edema or drainage. NEURO: II-XII grossly intact, peripheral sensation intact. 5/5 strength in 4 extremities.  BACK: No point tenderness PSYCH: AAOx3, appropriate affect Media Information     Document Information   Procedure: Skin Excision The risks and  benefits of the procedure were discussed with the patient. Verbal Consent was obtained. Area was prepped and draped in a sterile fashion. Approximately 1 cc Lidocaine with Epinephrine was instilled into the surgical area using a 25 gauge 1.5 inch needle. An elliptical excision measuring >0.5 cm diameter was made around the lesion using a scalpel. Pickups and the scalpel were then used to excise the lesion down to the subcutaneous tissue. Specimen was placed in specimen container and sent for pathology. Hemostasis was achieved. No complications. Patient tolerated well. Pressure dressing was applied.   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 Malignant lesion given skin flaking and history of recently removing a larger scab. - biopsy excised and sent today, will follow up results.  - Plan going forwards will depend on pathology results; may require Derm referral vs. Monitoring/treatment of AK - wound care instructions provided  Hearing loss of left ear due to cerumen impaction - patient complains of frequent cerumen impaction and poor hearing - uses peroxide and water to clean ears - ears free of cerumen today - no further treatment required. Encouraged continued use of peroxide and water as needed.  Neck pain on right side - likely muscular pain due to overuse - patient does yard work and endorses repetitive movement of shoulder, frequent lifting - no red flags, no neurologic findings on exam - recommend ibuprofen OTC for pain - ambulatory referral to physical therapy   Orders Placed This  Encounter  Procedures  . Flu Vaccine Quad 6-35 mos IM  . Ambulatory referral to Physical Therapy    Referral Priority:   Routine    Referral Type:   Physical Medicine    Referral Reason:   Specialty Services Required    Requested Specialty:   Physical Therapy    Number of Visits Requested:   1  - Dermatology pathology  Everrett Coombe, MD,MS, PGY1 05/05/2016  6:34 PM

## 2016-05-05 ENCOUNTER — Ambulatory Visit (INDEPENDENT_AMBULATORY_CARE_PROVIDER_SITE_OTHER): Payer: Self-pay | Admitting: Student in an Organized Health Care Education/Training Program

## 2016-05-05 ENCOUNTER — Encounter: Payer: Self-pay | Admitting: Student in an Organized Health Care Education/Training Program

## 2016-05-05 VITALS — BP 110/70 | HR 67 | Temp 98.4°F | Ht 65.0 in | Wt 156.4 lb

## 2016-05-05 DIAGNOSIS — D489 Neoplasm of uncertain behavior, unspecified: Secondary | ICD-10-CM | POA: Insufficient documentation

## 2016-05-05 DIAGNOSIS — M542 Cervicalgia: Secondary | ICD-10-CM | POA: Insufficient documentation

## 2016-05-05 DIAGNOSIS — H6122 Impacted cerumen, left ear: Secondary | ICD-10-CM | POA: Insufficient documentation

## 2016-05-05 DIAGNOSIS — Z23 Encounter for immunization: Secondary | ICD-10-CM

## 2016-05-05 NOTE — Assessment & Plan Note (Addendum)
Concern for actinic keratosis vs. Malignant lesion given skin flaking and history of recently removing a larger scab. - biopsy excised and sent today, will follow up results.  - Plan going forwards will depend on pathology results; may require Derm referral vs. Monitoring/treatment of AK - wound care instructions provided

## 2016-05-05 NOTE — Assessment & Plan Note (Signed)
-   patient complains of frequent cerumen impaction and poor hearing - uses peroxide and water to clean ears - ears free of cerumen today - no further treatment required. Encouraged continued use of peroxide and water as needed.

## 2016-05-05 NOTE — Assessment & Plan Note (Addendum)
-   likely muscular pain due to overuse - patient does yard work and endorses repetitive movement of shoulder, frequent lifting - no red flags, no neurologic findings on exam - recommend ibuprofen OTC for pain - ambulatory referral to physical therapy

## 2016-05-05 NOTE — Patient Instructions (Signed)
It was a pleasure seeing you today in our clinic. Here is the treatment plan we have discussed and agreed upon together:  Face biopsy - We will follow up the results of your face biopsy and inform you of the results - Please keep the area on your face dry and covered with a bandaid for the next two days  Neck Pain - Please use over the counter ibuprofen for your neck pain - I put in a referral for physical therapy. You will receive a call to schedule this appointment. If you do not receive a call within two weeks please call back our office.  Our clinic's number is [phone]. Please call with questions or concerns about what we discussed today.  Be well, Dr. Burr Medico

## 2016-05-26 ENCOUNTER — Telehealth: Payer: Self-pay | Admitting: *Deleted

## 2016-05-26 NOTE — Telephone Encounter (Signed)
Everrett Coombe, MD  P [facility]  Cc: Eloise Levels, MD        Please call and inform patient that the biopsy of his face lesion did not show any malignancy. It did show some mildly abnormal cells and so it is important that he comes in yearly so that we can continue to monitor lesion.  He should monitor his skin daily and follow up with [place] if he notices any changes.   Will forward to PCP as FYI.   Thank you,  Everrett Coombe, MD    Contacted pt and informed them of this message. Katharina Caper, Kaytlyn Din D, [place]

## 2016-06-08 ENCOUNTER — Telehealth: Payer: Self-pay | Admitting: Physical Therapy

## 2016-06-08 NOTE — Telephone Encounter (Signed)
05/15/16 attempted to call, unable to leave message, 05/22/16 left message to call. No return call>

## 2016-08-17 ENCOUNTER — Encounter (HOSPITAL_COMMUNITY): Payer: Self-pay | Admitting: *Deleted

## 2016-08-17 ENCOUNTER — Emergency Department (HOSPITAL_COMMUNITY)
Admission: EM | Admit: 2016-08-17 | Discharge: 2016-08-17 | Disposition: A | Discharge status: Home / Self Care | Payer: No Typology Code available for payment source | Attending: Emergency Medicine | Admitting: Emergency Medicine

## 2016-08-17 DIAGNOSIS — M542 Cervicalgia: Secondary | ICD-10-CM | POA: Insufficient documentation

## 2016-08-17 DIAGNOSIS — Z7722 Contact with and (suspected) exposure to environmental tobacco smoke (acute) (chronic): Secondary | ICD-10-CM | POA: Diagnosis not present

## 2016-08-17 DIAGNOSIS — Y92410 Unspecified street and highway as the place of occurrence of the external cause: Secondary | ICD-10-CM | POA: Diagnosis not present

## 2016-08-17 DIAGNOSIS — Y939 Activity, unspecified: Secondary | ICD-10-CM | POA: Diagnosis not present

## 2016-08-17 DIAGNOSIS — M545 Low back pain: Secondary | ICD-10-CM | POA: Insufficient documentation

## 2016-08-17 DIAGNOSIS — Y999 Unspecified external cause status: Secondary | ICD-10-CM | POA: Insufficient documentation

## 2016-08-17 DIAGNOSIS — S299XXA Unspecified injury of thorax, initial encounter: Secondary | ICD-10-CM | POA: Diagnosis present

## 2016-08-17 MED ORDER — IBUPROFEN 600 MG PO TABS: 600.0000 mg | ORAL_TABLET | Freq: Four times a day (QID) | ORAL | 0 refills | Status: DC | PRN

## 2016-08-17 MED ORDER — LIDOCAINE 5 % EX PTCH: 1.0000 | MEDICATED_PATCH | CUTANEOUS | 0 refills | Status: DC

## 2016-08-17 MED ORDER — METHOCARBAMOL 500 MG PO TABS: 500.0000 mg | ORAL_TABLET | Freq: Two times a day (BID) | ORAL | 0 refills | Status: DC

## 2016-08-17 NOTE — ED Notes (Signed)
Pt is in stable condition upon d/c and ambulates from ED. 

## 2016-08-17 NOTE — Discharge Instructions (Signed)
Expect your soreness to increase over the next 2-3 days. Take it easy, but do not lay around too much as this may make any stiffness worse.  Antiinflammatory medications: Take 500 mg of naproxen every 12 hours or 600 mg of ibuprofen every 6 hours for the next 3 days. Take these medications with food to avoid upset stomach. Choose only one of these medications, do not take them together.  Muscle relaxer: Robaxin is a muscle relaxer and may help loosen stiff muscles. Do not take the Robaxin while driving or performing other dangerous activities.   Lidocaine patches: These are available via either prescription or over-the-counter. The over-the-counter option may be more economical one and are likely just as effective. There are multiple over-the-counter brands, such as Salonpas.  Exercises: Be sure to perform the attached exercises starting with three times a week and working up to performing them daily. This is an essential part of preventing long term problems.   Follow up with a primary care provider for any future management of these complaints.
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
## 2017-11-17 ENCOUNTER — Encounter (HOSPITAL_COMMUNITY): Payer: Self-pay | Admitting: Emergency Medicine

## 2017-11-17 ENCOUNTER — Other Ambulatory Visit: Payer: Self-pay

## 2017-11-17 ENCOUNTER — Emergency Department (HOSPITAL_COMMUNITY)
Admission: EM | Admit: 2017-11-17 | Discharge: 2017-11-17 | Disposition: A | Discharge status: Home / Self Care | Payer: Self-pay | Attending: Emergency Medicine | Admitting: Emergency Medicine

## 2017-11-17 DIAGNOSIS — Y93H2 Activity, gardening and landscaping: Secondary | ICD-10-CM | POA: Insufficient documentation

## 2017-11-17 DIAGNOSIS — Z79899 Other long term (current) drug therapy: Secondary | ICD-10-CM | POA: Insufficient documentation

## 2017-11-17 DIAGNOSIS — S0501XA Injury of conjunctiva and corneal abrasion without foreign body, right eye, initial encounter: Secondary | ICD-10-CM | POA: Insufficient documentation

## 2017-11-17 DIAGNOSIS — Z7722 Contact with and (suspected) exposure to environmental tobacco smoke (acute) (chronic): Secondary | ICD-10-CM | POA: Insufficient documentation

## 2017-11-17 DIAGNOSIS — Y999 Unspecified external cause status: Secondary | ICD-10-CM | POA: Insufficient documentation

## 2017-11-17 DIAGNOSIS — X58XXXA Exposure to other specified factors, initial encounter: Secondary | ICD-10-CM | POA: Insufficient documentation

## 2017-11-17 DIAGNOSIS — Y929 Unspecified place or not applicable: Secondary | ICD-10-CM | POA: Insufficient documentation

## 2017-11-17 MED ORDER — FLUORESCEIN SODIUM 1 MG OP STRP
1.0000 | ORAL_STRIP | Freq: Once | OPHTHALMIC | Status: AC
  Administered 2017-11-17: 1 via OPHTHALMIC
  Filled 2017-11-17: qty 1

## 2017-11-17 MED ORDER — ERYTHROMYCIN 5 MG/GM OP OINT: TOPICAL_OINTMENT | OPHTHALMIC | 0 refills | Status: DC

## 2017-11-17 MED ORDER — TETANUS-DIPHTH-ACELL PERTUSSIS 5-2.5-18.5 LF-MCG/0.5 IM SUSP: 0.5000 mL | Freq: Once | INTRAMUSCULAR | Status: DC

## 2017-11-17 MED ORDER — TETRACAINE HCL 0.5 % OP SOLN
2.0000 [drp] | Freq: Once | OPHTHALMIC | Status: AC
  Administered 2017-11-17: 2 [drp] via OPHTHALMIC
  Filled 2017-11-17: qty 4

## 2017-11-17 NOTE — ED Provider Notes (Signed)
[facility] EMERGENCY DEPARTMENT Provider Note   CSN: [id number] Arrival date & time: 11/17/17  1819     History   Chief Complaint Chief Complaint  Patient presents with  . Foreign Body in [place] is a 42 y.o. male presents today for evaluation of acute onset, constant foreign body sensation of the right eye since around 1 PM.  He states that he was working outside [employer] and he felt a piece avoid fly into his eye.  He states that he feels as though something is scratching his eye when he closes his eyes or moves the eye a certain way.  He endorses redness and tearful drainage as well as some blurry vision secondary to tearing.  Denies diplopia.  Denies fever.  States his wife attempted to irrigate the eye and look for a foreign body but was unable to find one.  Unsure of tetanus is up-to-date.  He is not a contact lens wearer.  The history is provided by the patient.    Past Medical History:  Diagnosis Date  . Abrasion of anterior lower leg 07/29/2012  . Hand injury 03/31/2013  . PATELLAR DISLOCATION, LEFT 01/31/2010   Qualifier: Diagnosis of  By: Nori Riis MD, Clarise Cruz      Patient Active Problem List   Diagnosis Date Noted  . 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 Hearing loss of left ear due to cerumen impaction 05/05/2016  . Neck pain on right side 05/05/2016  . Pre-syncope 10/14/2013  . Urinary frequency 10/03/2013  . Headache, chronic daily 10/03/2013  . Irritable bowel syndrome 10/03/2013  . Migraine, unspecified, without mention of intractable migraine without mention of status migrainosus 02/14/2013  . Sinusitis, chronic 01/06/2013  . Lateral epicondylitis of right elbow 03/06/2011  . HYPERLIPIDEMIA 01/11/2010  . DECREASED LIBIDO 01/11/2010  . SOMATIC DYSFUNCTION, LUMBAR 12/21/2009  . BACK PAIN, CHRONIC 08/23/2009    History reviewed. No pertinent surgical history.      Home Medications    Prior to  Admission medications   Medication Sig Start Date End Date Taking? Authorizing Provider  erythromycin ophthalmic ointment Place a 1/2 inch ribbon of ointment into the lower eyelid 4 times daily 11/17/17   Nils Flack, Iren Whipp A, PA-C  fluticasone (FLONASE) 50 MCG/ACT nasal spray Place 2 sprays into the nose daily. 02/14/13   Willeen Niece, MD  ibuprofen (ADVIL,MOTRIN) 600 MG tablet Take 1 tablet (600 mg total) by mouth every 6 (six) hours as needed. 08/17/16   Joy, Shawn C, PA-C  lidocaine (LIDODERM) 5 % Place 1 patch onto the skin daily. Remove & Discard patch within 12 hours or as directed by MD 08/17/16   Joy, Shawn C, PA-C  methocarbamol (ROBAXIN) 500 MG tablet Take 1 tablet (500 mg total) by mouth 2 (two) times daily. 08/17/16   Lorayne Bender, PA-C    Family History No family history on file.  Social History Social History   Tobacco Use  . Smoking status: Passive Smoke Exposure - Never Smoker  . Smokeless tobacco: Never Used  Substance Use Topics  . Alcohol use: Yes  . Drug use: No     Allergies   Patient has no known allergies.   Review of Systems Review of Systems  Constitutional: Negative for fever.  Eyes: Positive for discharge, redness, itching and visual disturbance. Negative for photophobia.  Neurological: Negative for headaches.     Physical Exam Updated Vital Signs BP 133/88   Pulse 63  Temp 98.5 F (36.9 C) (Oral)   Resp 17   Ht 5\' 5"  (1.651 m)   Wt 68.9 kg (152 lb)   SpO2 98%   BMI 25.29 kg/m   Physical Exam  Constitutional: He appears well-developed and well-nourished. No distress.  HENT:  Head: Normocephalic and atraumatic.  Eyes: Pupils are equal, round, and reactive to light. EOM are normal. Right eye exhibits no discharge. Left eye exhibits no discharge. No scleral icterus.  Right eye with conjunctival injection.  No chemosis, proptosis, or consensual photophobia.  No erythema or swelling of the eyelids.  No tenderness palpation of the eyelids or the   orbital rim.  Eyelids everted, no foreign bodies noted.    Visual Acuity  Right Eye Distance: 20/160 Left Eye Distance: 20/20 Bilateral Distance: 20/16  On fluorescein stain there is a small triangular-shaped corneal abrasion to the inferior pole of the cornea.  No breast rings, foreign bodies, or dendritic lesions noted.  Seidel sign is absent    Neck: Normal range of motion. Neck supple. No JVD present. No tracheal deviation present.  Cardiovascular: Normal rate.  Pulmonary/Chest: Effort normal.  Abdominal: He exhibits no distension.  Musculoskeletal: He exhibits no edema.  Neurological: He is alert.  Skin: No erythema.  Psychiatric: He has a normal mood and affect. His behavior is normal.  Nursing note and vitals reviewed.    ED Treatments / Results  Labs (all labs ordered are listed, but only abnormal results are displayed) Labs Reviewed - No data to display  EKG None  Radiology No results found.  Procedures Procedures (including critical care time)  Medications Ordered in ED Medications  fluorescein ophthalmic strip 1 strip (1 strip Right Eye Given 11/17/17 2031)  tetracaine (PONTOCAINE) 0.5 % ophthalmic solution 2 drop (2 drops Right Eye Given 11/17/17 2031)     Initial Impression / Assessment and Plan / ED Course  I have reviewed the triage vital signs and the nursing notes.  Pertinent labs & imaging results that were available during my care of the patient were reviewed by me and considered in my medical decision making (see chart for details).     Pt with corneal abrasion on PE.  Patient is afebrile, vital signs are stable.  He is nontoxic in appearance.  Tdap given. Eye irrigated with normal saline, no evidence of foreign body.  He does have significant change in visual acuity in the right eye as compared to the left though this is likely secondary to excessive tearing causing blurred vision.  Pt is not a contact lens wearer.  Exam non-concerning for orbital  cellulitis, hyphema, HSV, optic neuritis, or corneal ulcers. Patient will be discharged home with erythromycin.  Regular follow-up with ophthalmology in the next 48 hours.  Discussed strict ED return precautions. Pt verbalized understanding of and agreement with plan and is safe for discharge home at this time.  No complaints prior to discharge.  Final Clinical Impressions(s) / ED Diagnoses   Final diagnoses:  Abrasion of right cornea, initial encounter    ED Discharge Orders        Ordered    erythromycin ophthalmic ointment     11/17/17 2051       Renita Papa, PA-C 11/18/17 0102    Lajean Saver, MD 11/18/17 [phone]

## 2017-11-17 NOTE — Discharge Instructions (Signed)
Apply the antibiotics as prescribed.  Use cool compresses 2-3 times daily. Alternate 600 mg of ibuprofen and [id number] mg of Tylenol every 3 hours as needed for pain. Do not exceed 4000 mg of Tylenol daily.  Take ibuprofen with food to avoid upset stomach issues.  Follow-up with the ophthalmologist in the next 48 hours or so.  Return to the emergency department immediately for any concerning signs or symptoms develop such as worsening vision changes, swelling of the eye the legs, severe headaches, or fevers.

## 2017-11-17 NOTE — ED Triage Notes (Signed)
Pt reports he was trimming shrubs and felt something fly into his eye. Pain is bad.

## 2018-07-13 ENCOUNTER — Ambulatory Visit (HOSPITAL_COMMUNITY)
Admission: EM | Admit: 2018-07-13 | Discharge: 2018-07-13 | Disposition: A | Discharge status: Home / Self Care | Payer: Self-pay | Attending: Family Medicine | Admitting: Family Medicine

## 2018-07-13 ENCOUNTER — Encounter (HOSPITAL_COMMUNITY): Payer: Self-pay

## 2018-07-13 DIAGNOSIS — J019 Acute sinusitis, unspecified: Secondary | ICD-10-CM

## 2018-07-13 MED ORDER — CETIRIZINE HCL 10 MG PO CAPS: 10.0000 mg | ORAL_CAPSULE | Freq: Every day | ORAL | 0 refills | Status: AC

## 2018-07-13 MED ORDER — FLUTICASONE PROPIONATE 50 MCG/ACT NA SUSP: 1.0000 | Freq: Every day | NASAL | 0 refills | Status: AC

## 2018-07-13 MED ORDER — BENZONATATE 200 MG PO CAPS: 200.0000 mg | ORAL_CAPSULE | Freq: Three times a day (TID) | ORAL | 0 refills | Status: AC | PRN

## 2018-07-13 MED ORDER — AMOXICILLIN-POT CLAVULANATE 875-125 MG PO TABS: 1.0000 | ORAL_TABLET | Freq: Two times a day (BID) | ORAL | 0 refills | Status: AC

## 2018-07-13 NOTE — Discharge Instructions (Signed)
Please begin taking Augmentin twice daily for the next 10 days Please begin daily allergy pill like Zyrtec or Claritin to help with congestion drainage, I sent the generic of Zyrtec for you, you may get this over-the-counter if it is cheaper Flonase nasal spray 1 to 2 sprays in each nostril daily to help with congestion and ear discomfort Tessalon for cough as needed every 8 hours Rest, drink plenty of fluids  Please follow-up if symptoms still not resolving, worsening, developing fevers, shortness of breath, difficulty breathing

## 2018-07-13 NOTE — ED Provider Notes (Signed)
[facility]    CSN: [id number] Arrival date & time: 07/13/18  1054     History   Chief Complaint Chief Complaint  Patient presents with  . Cough  . Nasal Congestion  . Otalgia    HPI William Blankenship is a 43 y.o. male history of migraines, hyperlipidemia presenting today for evaluation of URI symptoms.  Patient states that he has had cough, congestion and ear discomfort.  Symptoms have been going on for 3 weeks off and on.  He thought his symptoms were improving, but yesterday worsened again.  He also notes subjective fevers.  He has tried taking DayQuil/NyQuil and Tylenol without relief.  Denies any shortness of breath or chest discomfort.  Patient is here with his 3 sons who all have similar symptoms as well.  Denies any recent travel, denies any known exposure to COVID-19.   HPI  Past Medical History:  Diagnosis Date  . Abrasion of anterior lower leg 07/29/2012  . Hand injury 03/31/2013  . PATELLAR DISLOCATION, LEFT 01/31/2010   Qualifier: Diagnosis of  By: Nori Riis MD, Clarise Cruz      Patient Active Problem List   Diagnosis Date Noted  . Neoplasm of uncertain behavior 74/03/8785  . Hearing loss of left ear due to cerumen impaction 05/05/2016  . Neck pain on right side 05/05/2016  . Pre-syncope 10/14/2013  . Urinary frequency 10/03/2013  . Headache, chronic daily 10/03/2013  . Irritable bowel syndrome 10/03/2013  . Migraine, unspecified, without mention of intractable migraine without mention of status migrainosus 02/14/2013  . Sinusitis, chronic 01/06/2013  . Lateral epicondylitis of right elbow 03/06/2011  . HYPERLIPIDEMIA 01/11/2010  . DECREASED LIBIDO 01/11/2010  . SOMATIC DYSFUNCTION, LUMBAR 12/21/2009  . BACK PAIN, CHRONIC 08/23/2009    History reviewed. No pertinent surgical history.     Home Medications    Prior to Admission medications   Medication Sig Start Date End Date Taking? Authorizing Provider  amoxicillin-clavulanate (AUGMENTIN) 875-125  MG tablet Take 1 tablet by mouth every 12 (twelve) hours for 10 days. 07/13/18 07/23/18  Rebeccah Ivins C, PA-C  benzonatate (TESSALON) 200 MG capsule Take 1 capsule (200 mg total) by mouth 3 (three) times daily as needed for up to 7 days for cough. 07/13/18 07/20/18  Darl Kuss C, PA-C  Cetirizine HCl 10 MG CAPS Take 1 capsule (10 mg total) by mouth daily for 10 days. 07/13/18 07/23/18  Ashlyn Cabler C, PA-C  erythromycin ophthalmic ointment Place a 1/2 inch ribbon of ointment into the lower eyelid 4 times daily 11/17/17   Nils Flack, Mina A, PA-C  fluticasone (FLONASE) 50 MCG/ACT nasal spray Place 1-2 sprays into both nostrils daily for 7 days. 07/13/18 07/20/18  Rashaunda Rahl C, PA-C  ibuprofen (ADVIL,MOTRIN) 600 MG tablet Take 1 tablet (600 mg total) by mouth every 6 (six) hours as needed. 08/17/16   Joy, Shawn C, PA-C  lidocaine (LIDODERM) 5 % Place 1 patch onto the skin daily. Remove & Discard patch within 12 hours or as directed by MD 08/17/16   Joy, Shawn C, PA-C  methocarbamol (ROBAXIN) 500 MG tablet Take 1 tablet (500 mg total) by mouth 2 (two) times daily. 08/17/16   Lorayne Bender, PA-C    Family History History reviewed. No pertinent family history.  Social History Social History   Tobacco Use  . Smoking status: Passive Smoke Exposure - Never Smoker  . Smokeless tobacco: Never Used  Substance Use Topics  . Alcohol use: Yes  . Drug use: No  Allergies   Patient has no known allergies.   Review of Systems Review of Systems  Constitutional: Negative for activity change, appetite change, chills, fatigue and fever.  HENT: Positive for congestion, ear pain, rhinorrhea and sore throat. Negative for sinus pressure and trouble swallowing.   Eyes: Negative for discharge and redness.  Respiratory: Positive for cough. Negative for chest tightness and shortness of breath.   Cardiovascular: Negative for chest pain.  Gastrointestinal: Positive for abdominal pain. Negative for diarrhea,  nausea and vomiting.  Musculoskeletal: Negative for myalgias.  Skin: Negative for rash.  Neurological: Negative for dizziness, light-headedness and headaches.     Physical Exam Triage Vital Signs ED Triage Vitals [07/13/18 1127]  Enc Vitals Group     BP 118/80     Pulse Rate 73     Resp 18     Temp 98.9 F (37.2 C)     Temp Source Oral     SpO2 98 %     Weight      Height      Head Circumference      Peak Flow      Pain Score 8     Pain Loc      Pain Edu?      Excl. in [place]?    No data found.  Updated Vital Signs BP 118/80 (BP Location: Right Arm)   Pulse 73   Temp 98.9 F (37.2 C) (Oral)   Resp 18   SpO2 98%   Visual Acuity Right Eye Distance:   Left Eye Distance:   Bilateral Distance:    Right Eye Near:   Left Eye Near:    Bilateral Near:     Physical Exam Vitals signs and nursing note reviewed.  Constitutional:      Appearance: He is well-developed.  HENT:     Head: Normocephalic and atraumatic.     Ears:     Comments: Bilateral ears without tenderness to palpation of external auricle, tragus and mastoid, EAC's without erythema or swelling, TM's with good bony landmarks, slightly dull but non erythematous.    Mouth/Throat:     Comments: Oral mucosa pink and moist, no tonsillar enlargement or exudate. Posterior pharynx patent and nonerythematous, no uvula deviation or swelling. Normal phonation. Eyes:     Conjunctiva/sclera: Conjunctivae normal.  Neck:     Musculoskeletal: Neck supple.  Cardiovascular:     Rate and Rhythm: Normal rate and regular rhythm.     Heart sounds: No murmur.  Pulmonary:     Effort: Pulmonary effort is normal. No respiratory distress.     Breath sounds: Normal breath sounds.     Comments: Breathing comfortably at rest, CTABL, no wheezing, rales or other adventitious sounds auscultated Abdominal:     Palpations: Abdomen is soft.     Tenderness: There is abdominal tenderness.     Comments: Mild tenderness to left lower  quadrant, negative rebound, negative Rovsing, negative McBurney's.  No tenderness to epigastrium or upper quadrants.  Skin:    General: Skin is warm and dry.  Neurological:     Mental Status: He is alert.      UC Treatments / Results  Labs (all labs ordered are listed, but only abnormal results are displayed) Labs Reviewed - No data to display  EKG None  Radiology No results found.  Procedures Procedures (including critical care time)  Medications Ordered in UC Medications - No data to display  Initial Impression / Assessment and Plan / UC Course  I have reviewed the triage vital signs and the nursing notes.  Pertinent labs & imaging results that were available during my care of the patient were reviewed by me and considered in my medical decision making (see chart for details).     Patient with URI symptoms off and on x3 weeks.  Will treat for sinusitis with Augmentin x10 days, continue symptomatic and supportive care as well.  Vital signs stable and lungs clear today.  Continue to monitor,Discussed strict return precautions. Patient verbalized understanding and is agreeable with plan.  Final Clinical Impressions(s) / UC Diagnoses   Final diagnoses:  Acute sinusitis with symptoms > 10 days     Discharge Instructions     Please begin taking Augmentin twice daily for the next 10 days Please begin daily allergy pill like Zyrtec or Claritin to help with congestion drainage, I sent the generic of Zyrtec for you, you may get this over-the-counter if it is cheaper Flonase nasal spray 1 to 2 sprays in each nostril daily to help with congestion and ear discomfort Tessalon for cough as needed every 8 hours Rest, drink plenty of fluids  Please follow-up if symptoms still not resolving, worsening, developing fevers, shortness of breath, difficulty breathing   ED Prescriptions    Medication Sig Dispense Auth. Provider   amoxicillin-clavulanate (AUGMENTIN) 875-125 MG tablet  Take 1 tablet by mouth every 12 (twelve) hours for 10 days. 20 tablet Chava Dulac C, PA-C   Cetirizine HCl 10 MG CAPS Take 1 capsule (10 mg total) by mouth daily for 10 days. 10 capsule Milanna Kozlov C, PA-C   fluticasone (FLONASE) 50 MCG/ACT nasal spray Place 1-2 sprays into both nostrils daily for 7 days. 1 g Mireyah Chervenak C, PA-C   benzonatate (TESSALON) 200 MG capsule Take 1 capsule (200 mg total) by mouth 3 (three) times daily as needed for up to 7 days for cough. 28 capsule Maysin Carstens C, PA-C     Controlled Substance Prescriptions  Controlled Substance Registry consulted? Not Applicable   Janith Lima, [place] 07/13/18 1222

## 2018-07-13 NOTE — ED Triage Notes (Signed)
Pt present cough, both ears are in pain, and nasal drainage. Symptoms has been going on for over 3 wks . Pt has OTC medication with no relief.

## 2018-12-19 ENCOUNTER — Other Ambulatory Visit: Payer: Self-pay

## 2018-12-19 ENCOUNTER — Ambulatory Visit (HOSPITAL_COMMUNITY)
Admission: EM | Admit: 2018-12-19 | Discharge: 2018-12-19 | Disposition: A | Discharge status: Home / Self Care | Payer: Self-pay | Attending: Family Medicine | Admitting: Family Medicine

## 2018-12-19 ENCOUNTER — Encounter (HOSPITAL_COMMUNITY): Payer: Self-pay

## 2018-12-19 DIAGNOSIS — H60393 Other infective otitis externa, bilateral: Secondary | ICD-10-CM

## 2018-12-19 MED ORDER — NEOMYCIN-POLYMYXIN-HC 3.5-10000-1 OT SUSP: 4.0000 [drp] | Freq: Three times a day (TID) | OTIC | 2 refills | Status: DC

## 2018-12-19 NOTE — ED Triage Notes (Signed)
Patient presents to Urgent Care with complaints of bilateral ear pain since 4 days ago. 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

## 2018-12-19 NOTE — ED Provider Notes (Signed)
[facility]    CSN: [id number] Arrival date & time: 12/19/18  1612      History   Chief Complaint Chief Complaint  Patient presents with  . Otalgia    HPI William Blankenship is a 43 y.o. male.   HPI patient works as a Development worker, international aid.  He wears earplugs at work.  He states his hands are always dirty.  They often have chemicals.  He uses his hands to squish the earplugs and put them in.  He thinks that these dirty earplugs are causing his ears to hurt.  Both of his ears are painful with movement.  He has been seen for this before.  He has had otitis externa.  No cold symptoms.  No runny nose.  No fever  Past Medical History:  Diagnosis Date  . Abrasion of anterior lower leg 07/29/2012  . Hand injury 03/31/2013  . PATELLAR DISLOCATION, LEFT 01/31/2010   Qualifier: Diagnosis of  By: Nori Riis MD, Clarise Cruz      Patient Active Problem List   Diagnosis Date Noted  . Neoplasm of uncertain behavior [id number]  . Hearing loss of left ear due to cerumen impaction 05/05/2016  . Neck pain on right side 05/05/2016  . Pre-syncope 10/14/2013  . Urinary frequency 10/03/2013  . Headache, chronic daily 10/03/2013  . Irritable bowel syndrome 10/03/2013  . Migraine, unspecified, without mention of intractable migraine without mention of status migrainosus 02/14/2013  . Sinusitis, chronic 01/06/2013  . Lateral epicondylitis of right elbow 03/06/2011  . HYPERLIPIDEMIA 01/11/2010  . DECREASED LIBIDO 01/11/2010  . SOMATIC DYSFUNCTION, LUMBAR 12/21/2009  . BACK PAIN, CHRONIC 08/23/2009    History reviewed. No pertinent surgical history.     Home Medications    Prior to Admission medications   Medication Sig Start Date End Date Taking? Authorizing Provider  Cetirizine HCl 10 MG CAPS Take 1 capsule (10 mg total) by mouth daily for 10 days. 07/13/18 07/23/18  Wieters, Hallie C, PA-C  fluticasone (FLONASE) 50 MCG/ACT nasal spray Place 1-2 sprays into both nostrils daily for 7 days. 07/13/18  07/20/18  Wieters, Hallie C, PA-C  ibuprofen (ADVIL,MOTRIN) 600 MG tablet Take 1 tablet (600 mg total) by mouth every 6 (six) hours as needed. 08/17/16   Joy, Shawn C, PA-C  methocarbamol (ROBAXIN) 500 MG tablet Take 1 tablet (500 mg total) by mouth 2 (two) times daily. 08/17/16   Joy, Shawn C, PA-C  neomycin-polymyxin-hydrocortisone (CORTISPORIN) 3.5-10000-1 OTIC suspension Place 4 drops into both ears 3 (three) times daily. 12/19/18   Raylene Everts, MD    Family History Family History  Problem Relation Age of Onset  . Healthy Mother     Social History Social History   Tobacco Use  . Smoking status: Passive Smoke Exposure - Never Smoker  . Smokeless tobacco: Never Used  Substance Use Topics  . Alcohol use: Yes  . Drug use: No     Allergies   Patient has no known allergies.   Review of Systems Review of Systems  Constitutional: Negative for chills and fever.  HENT: Positive for ear pain. Negative for sore throat.   Eyes: Negative for pain and visual disturbance.  Respiratory: Negative for cough and shortness of breath.   Cardiovascular: Negative for chest pain and palpitations.  Gastrointestinal: Negative for abdominal pain and vomiting.  Genitourinary: Negative for dysuria and hematuria.  Musculoskeletal: Negative for arthralgias and back pain.  Skin: Negative for color change and rash.  Neurological: Negative for seizures and syncope.  All  other systems reviewed and are negative.    Physical Exam Triage Vital Signs ED Triage Vitals  Enc Vitals Group     BP 12/19/18 1640 139/88     Pulse Rate 12/19/18 1640 85     Resp 12/19/18 1640 17     Temp 12/19/18 1640 97.8 F (36.6 C)     Temp Source 12/19/18 1640 Oral     SpO2 12/19/18 1640 100 %     Weight --      Height --      Head Circumference --      Peak Flow --      Pain Score 12/19/18 1638 8     Pain Loc --      Pain Edu? --      Excl. in [place]? --    No data found.  Updated Vital Signs BP 139/88 (BP  Location: Left Arm)   Pulse 85   Temp 97.8 F (36.6 C) (Oral)   Resp 17   SpO2 100%   Visual Acuity Right Eye Distance:   Left Eye Distance:   Bilateral Distance:    Right Eye Near:   Left Eye Near:    Bilateral Near:     Physical Exam Constitutional:      General: He is not in acute distress.    Appearance: He is well-developed.  HENT:     Head: Normocephalic and atraumatic.     Right Ear: Tympanic membrane normal.     Left Ear: Tympanic membrane normal.     Ears:     Comments: Patient has otitis externa right greater than left.  Right ear canal is bleeding.  Both are slightly swollen and macerated.  Pain with traction of the pinna    Nose: Nose normal.     Mouth/Throat:     Pharynx: No posterior oropharyngeal erythema.  Eyes:     Conjunctiva/sclera: Conjunctivae normal.     Pupils: Pupils are equal, round, and reactive to light.  Neck:     Musculoskeletal: Normal range of motion.  Cardiovascular:     Rate and Rhythm: Normal rate.  Pulmonary:     Effort: Pulmonary effort is normal. No respiratory distress.  Abdominal:     General: There is no distension.     Palpations: Abdomen is soft.  Musculoskeletal: Normal range of motion.  Skin:    General: Skin is warm and dry.  Neurological:     Mental Status: He is alert.  Psychiatric:        Mood and Affect: Mood normal.        Behavior: Behavior normal.      UC Treatments / Results  Labs (all labs ordered are listed, but only abnormal results are displayed) Labs Reviewed - No data to display  EKG   Radiology No results found.  Procedures Procedures (including critical care time)  Medications Ordered in UC Medications - No data to display  Initial Impression / Assessment and Plan / UC Course  I have reviewed the triage vital signs and the nursing notes.  Pertinent labs & imaging results that were available during my care of the patient were reviewed by me and considered in my medical decision making  (see chart for details).      Final Clinical Impressions(s) / UC Diagnoses   Final diagnoses:  Infective otitis externa of both ears     Discharge Instructions     Use ear drops for the ear pain Return if needed  ED Prescriptions    Medication Sig Dispense Auth. Provider   neomycin-polymyxin-hydrocortisone (CORTISPORIN) 3.5-10000-1 OTIC suspension Place 4 drops into both ears 3 (three) times daily. 10 mL Raylene Everts, MD     Controlled Substance Prescriptions [place] Controlled Substance Registry consulted? Not Applicable   Raylene Everts, MD 12/19/18 2110

## 2018-12-19 NOTE — Discharge Instructions (Signed)
Use ear drops for the ear pain Return if needed

## 2021-05-13 ENCOUNTER — Ambulatory Visit (HOSPITAL_COMMUNITY)
Admission: EM | Admit: 2021-05-13 | Discharge: 2021-05-13 | Disposition: A | Discharge status: Home / Self Care | Payer: Self-pay | Attending: Family Medicine | Admitting: Family Medicine

## 2021-05-13 ENCOUNTER — Encounter (HOSPITAL_COMMUNITY): Payer: Self-pay | Admitting: Emergency Medicine

## 2021-05-13 ENCOUNTER — Other Ambulatory Visit: Payer: Self-pay

## 2021-05-13 DIAGNOSIS — J069 Acute upper respiratory infection, unspecified: Secondary | ICD-10-CM

## 2021-05-13 DIAGNOSIS — H6692 Otitis media, unspecified, left ear: Secondary | ICD-10-CM

## 2021-05-13 MED ORDER — AMOXICILLIN 875 MG PO TABS: 875.0000 mg | ORAL_TABLET | Freq: Two times a day (BID) | ORAL | 0 refills | Status: AC

## 2021-05-13 NOTE — ED Triage Notes (Signed)
Pt reports that had bilat ear pain, cough, fever and sore throat since Sunday. Reports yesterday when was trying to sleep and felt SOB.

## 2021-05-13 NOTE — ED Provider Notes (Signed)
[place]    CSN: [id number] Arrival date & time: 05/13/21  3335      History   Chief Complaint Chief Complaint  Patient presents with   Otalgia   Cough    HPI William Blankenship is a 46 y.o. male.    Otalgia Associated symptoms: cough   Cough Associated symptoms: ear pain   Here for history of cough, fever and chills, sore throat, and bilateral ear pain since January 15.  He has had some loose stools through this time, but no vomiting or nausea.  Yesterday he had wheezing and felt short of breath.  That has resolved.  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 No pertinent surgical history.     Home Medications    Prior to Admission medications   Medication Sig Start Date End Date Taking? Authorizing Provider  amoxicillin (AMOXIL) 875 MG tablet Take 1 tablet (875 mg total) by mouth 2 (two) times daily for 7 days. 05/13/21 05/20/21 Yes Sargent Mankey, Gwenlyn Perking, MD  Cetirizine HCl 10 MG CAPS Take 1 capsule (10 mg total) by mouth daily for 10 days. 07/13/18 07/23/18  Wieters, Hallie C,  PA-C  fluticasone (FLONASE) 50 MCG/ACT nasal spray Place 1-2 sprays into both nostrils daily for 7 days. 07/13/18 07/20/18  Wieters, Hallie C, PA-C  ibuprofen (ADVIL,MOTRIN) 600 MG tablet Take 1 tablet (600 mg total) by mouth every 6 (six) hours as needed. 08/17/16   Joy, Helane Gunther, PA-C    Family History Family History  Problem Relation Age of Onset   Healthy Mother     Social History Social History   Tobacco Use   Smoking status: Passive Smoke Exposure - Never Smoker   Smokeless tobacco: Never  Vaping Use   Vaping Use: Never used  Substance Use Topics   Alcohol use: Yes   Drug use: No     Allergies   Patient has no known allergies.   Review of Systems Review of Systems  HENT:  Positive for ear pain.   Respiratory:  Positive for cough.     Physical Exam Triage Vital Signs ED Triage Vitals  Enc Vitals Group     BP 05/13/21 0924 129/85     Pulse Rate 05/13/21 0924 61     Resp 05/13/21 0924 16     Temp 05/13/21 0924 98.3 F (36.8 C)     Temp Source 05/13/21 0924 Oral     SpO2 05/13/21 0924 99 %     Weight --      Height --      Head Circumference --  Peak Flow --      Pain Score 05/13/21 0921 6     Pain Loc --      Pain Edu? --      Excl. in [place]? --    No data found.  Updated Vital Signs BP 129/85 (BP Location: Right Arm)    Pulse 61    Temp 98.3 F (36.8 C) (Oral)    Resp 16    SpO2 99%   Visual Acuity Right Eye Distance:   Left Eye Distance:   Bilateral Distance:    Right Eye Near:   Left Eye Near:    Bilateral Near:     Physical Exam Vitals reviewed.  Constitutional:      General: He is not in acute distress.    Appearance: He is not toxic-appearing.  HENT:     Right Ear: Ear canal normal.     Left Ear: Ear canal normal.     Ears:     Comments: Right TM is shiny, with some white opacification inferiorly, like scarring. Left TM has similar scar inferiorly, but some erythema in the upper portion    Nose: Nose normal.     Mouth/Throat:      Mouth: Mucous membranes are moist.     Pharynx: No oropharyngeal exudate or posterior oropharyngeal erythema.  Eyes:     Extraocular Movements: Extraocular movements intact.     Conjunctiva/sclera: Conjunctivae normal.     Pupils: Pupils are equal, round, and reactive to light.  Cardiovascular:     Rate and Rhythm: Normal rate and regular rhythm.     Heart sounds: No murmur heard. Pulmonary:     Effort: Pulmonary effort is normal.     Breath sounds: Normal breath sounds.  Musculoskeletal:     Cervical back: Neck supple.  Lymphadenopathy:     Cervical: No cervical adenopathy.  Skin:    Capillary Refill: Capillary refill takes less than 2 seconds.     Coloration: Skin is not jaundiced or pale.  Neurological:     General: No focal deficit present.     Mental Status: He is alert and oriented to person, place, and time.  Psychiatric:        Behavior: Behavior normal.     UC Treatments / Results  Labs (all labs ordered are listed, but only abnormal results are displayed) Labs Reviewed - No data to display  EKG   Radiology No results found.  Procedures Procedures (including critical care time)  Medications Ordered in UC Medications - No data to display  Initial Impression / Assessment and Plan / UC Course  I have reviewed the triage vital signs and the nursing notes.  Pertinent labs & imaging results that were available during my care of the patient were reviewed by me and considered in my medical decision making (see chart for details).     I wanted to do a CXR, since he had wheezed and felt short of breath, but he declined, due to lack of funding. He also declined covid testing due to potential costs. Final Clinical Impressions(s) / UC Diagnoses   Final diagnoses:  Viral upper respiratory tract infection  Left otitis media, unspecified otitis media type     Discharge Instructions      Take amoxicillin 875 mg, 1 tab twice daily for 7 days.(Tome amoxicillin 875  mg, 1 tableta 2 veces al dia por 7 dias).       ED Prescriptions     Medication Sig Dispense Auth.  Provider   amoxicillin (AMOXIL) 875 MG tablet Take 1 tablet (875 mg total) by mouth 2 (two) times daily for 7 days. 14 tablet Jashua Knaak, Gwenlyn Perking, MD      PDMP not reviewed this encounter.   Barrett Henle, MD 05/13/21 [phone]

## 2021-05-13 NOTE — Discharge Instructions (Signed)
Take amoxicillin 875 mg, 1 tab twice daily for 7 days.(Tome amoxicillin 875 mg, 1 tableta 2 veces al dia por 7 dias).

## 2022-11-06 ENCOUNTER — Other Ambulatory Visit: Payer: Self-pay

## 2022-11-06 ENCOUNTER — Emergency Department (HOSPITAL_COMMUNITY): Payer: Self-pay

## 2022-11-06 ENCOUNTER — Emergency Department (HOSPITAL_COMMUNITY)
Admission: EM | Admit: 2022-11-06 | Discharge: 2022-11-06 | Disposition: A | Discharge status: Home / Self Care | Payer: Self-pay | Attending: Emergency Medicine | Admitting: Emergency Medicine

## 2022-11-06 DIAGNOSIS — W11XXXA Fall on and from ladder, initial encounter: Secondary | ICD-10-CM | POA: Insufficient documentation

## 2022-11-06 DIAGNOSIS — T675XXA Heat exhaustion, unspecified, initial encounter: Secondary | ICD-10-CM | POA: Insufficient documentation

## 2022-11-06 DIAGNOSIS — R55 Syncope and collapse: Secondary | ICD-10-CM | POA: Insufficient documentation

## 2022-11-06 LAB — BASIC METABOLIC PANEL
Anion gap: 9 (ref 5–15)
BUN: 16 mg/dL (ref 6–20)
CO2: 20 mmol/L — ABNORMAL LOW (ref 22–32)
Calcium: 8.9 mg/dL (ref 8.9–10.3)
Chloride: 108 mmol/L (ref 98–111)
Creatinine, Ser: 1.1 mg/dL (ref 0.61–1.24)
GFR, Estimated: >60 mL/min (ref >60)
Glucose, Bld: 104 mg/dL — ABNORMAL HIGH (ref 70–99)
Potassium: 4.4 mmol/L (ref 3.5–5.1)
Sodium: 137 mmol/L (ref 135–145)

## 2022-11-06 LAB — CBC
HCT: 37.5 % — ABNORMAL LOW (ref 39.0–52.0)
Hemoglobin: 11.1 g/dL — ABNORMAL LOW (ref 13.0–17.0)
MCH: 23 pg — ABNORMAL LOW (ref 26.0–34.0)
MCHC: 29.6 g/dL — ABNORMAL LOW (ref 30.0–36.0)
MCV: 77.8 fL — ABNORMAL LOW (ref 80.0–100.0)
Platelets: 420 10*3/uL — ABNORMAL HIGH (ref 150–400)
RBC: 4.82 MIL/uL (ref 4.22–5.81)
RDW: 15.4 % (ref 11.5–15.5)
WBC: 13.4 10*3/uL — ABNORMAL HIGH (ref 4.0–10.5)
nRBC: 0 % (ref 0.0–0.2)

## 2022-11-06 LAB — TROPONIN I (HIGH SENSITIVITY): Troponin I (High Sensitivity): 12 ng/L (ref <18)

## 2022-11-06 NOTE — ED Provider Notes (Signed)
[place] [facility] AT [facility] Provider Note   CSN: [id number] Arrival date & time: 11/06/22  1238     History {Add pertinent medical, surgical, social history, OB history to HPI:1} Chief Complaint  Patient presents with   Fall   Loss of Consciousness    William Blankenship is a 47 y.o. male presented to ED complaining of brief loss of consciousness.  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  He noted that intermittently began to have episodes where his vision was darkening with exertion.  Try to sit down on the lower steps of a ladder to rest, and then said his vision darkened again and he fell down onto the ground.  He woke up a few seconds later with his cheek on the ground.  He said he felt swimmy headed and dizzy.  He got home and his wife brought him to the hospital.  Since arriving in our hospital nearly 5 hours ago, patient significant improvement of all of his symptoms, says he no longer feels dizzy, he is back to baseline.  His wife reports the patient drinks "Monster" energy drinks.  Patient denies any chest pain or palpitations or difficulty breathing.  Denies personal or family history of seizure.  HPI     Home Medications Prior to Admission medications   Medication Sig Start Date End Date Taking? Authorizing Provider  Cetirizine HCl 10 MG CAPS Take 1 capsule (10 mg total) by mouth daily for 10 days. 07/13/18 07/23/18  Wieters, Hallie C, PA-C  fluticasone (FLONASE) 50 MCG/ACT nasal spray Place 1-2 sprays into both nostrils daily for 7 days. 07/13/18 07/20/18  Wieters, Hallie C, PA-C  ibuprofen (ADVIL,MOTRIN) 600 MG tablet Take 1 tablet (600 mg total) by mouth every 6 (six) hours as needed. 08/17/16   Joy, Hillard Danker, PA-C      Allergies    Patient has no known allergies.    Review of Systems   Review of Systems  Physical Exam Updated Vital  Signs BP 129/88   Pulse 65   Temp 98.4 F (36.9 C) (Temporal)   Resp 19   SpO2 100%  Physical Exam Constitutional:      General: He is not in acute distress. HENT:     Head: Normocephalic and atraumatic.  Eyes:     Conjunctiva/sclera: Conjunctivae normal.     Pupils: Pupils are equal, round, and reactive to light.  Cardiovascular:     Rate and Rhythm: Normal rate and regular rhythm.  Pulmonary:     Effort: Pulmonary effort is normal. No respiratory distress.  Abdominal:     General: There is no distension.     Tenderness: There is no abdominal tenderness.  Musculoskeletal:     Comments: Mild tenderness beneath the left scapula on palpation, full range of motion of the upper and lower extremities, no spinal midline tenderness  Skin:    General: Skin is warm and dry.  Neurological:     General: No focal deficit present.     Mental Status: He is alert. Mental status is at baseline.  Psychiatric:        Mood and Affect: Mood normal.        Behavior: Behavior normal.     ED Results / Procedures / Treatments   Labs (all labs ordered are listed, but only abnormal results are displayed) Labs Reviewed  BASIC METABOLIC PANEL -  Abnormal; Notable for the following components:      Result Value   CO2 20 (*)    Glucose, Bld 104 (*)    All other components within normal limits  CBC - Abnormal; Notable for the following components:   WBC 13.4 (*)    Hemoglobin 11.1 (*)    HCT 37.5 (*)    MCV 77.8 (*)    MCH 23.0 (*)    MCHC 29.6 (*)    Platelets 420 (*)    All other components within normal limits  TROPONIN I (HIGH SENSITIVITY)    EKG None  Radiology CT Cervical Spine Wo Contrast  Result Date: 11/06/2022 CLINICAL DATA:  Fall with loss of consciousness and neck trauma EXAM: CT CERVICAL SPINE WITHOUT CONTRAST TECHNIQUE: Multidetector CT imaging of the cervical spine was performed without intravenous contrast. Multiplanar CT image reconstructions were also generated.  RADIATION DOSE REDUCTION: This exam was performed according to the departmental dose-optimization program which includes automated exposure control, adjustment of the mA and/or kV according to patient size and/or use of iterative reconstruction technique. COMPARISON:  None Available. FINDINGS: Alignment: No evidence of traumatic malalignment. Skull base and vertebrae: No acute fracture. No primary bone lesion or focal pathologic process. Soft tissues and spinal canal: No prevertebral fluid or swelling. No visible canal hematoma. Disc levels: No disc space height loss. No significant spinal canal or neural foraminal narrowing. Upper chest: No acute abnormality. Other: None. IMPRESSION: No cervical spine fracture. Electronically Signed   By: Minerva Fester M.D.   On: 11/06/2022 18:00   CT Head Wo Contrast  Result Date: 11/06/2022 CLINICAL DATA:  Head trauma. EXAM: CT HEAD WITHOUT CONTRAST TECHNIQUE: Contiguous axial images were obtained from the base of the skull through the vertex without intravenous contrast. RADIATION DOSE REDUCTION: This exam was performed according to the departmental dose-optimization program which includes automated exposure control, adjustment of the mA and/or kV according to patient size and/or use of iterative reconstruction technique. COMPARISON:  October 07, 2013 FINDINGS: Brain: No evidence of acute infarction, hemorrhage, hydrocephalus, extra-axial collection or mass lesion/mass effect. Vascular: No hyperdense vessel or unexpected calcification. Skull: Normal. Negative for fracture or focal lesion. Sinuses/Orbits: No acute finding. Other: None. IMPRESSION: No acute intracranial abnormality. Electronically Signed   By: Ted Mcalpine M.D.   On: 11/06/2022 17:58    Procedures Procedures  {Document cardiac monitor, telemetry assessment procedure when appropriate:1}  Medications Ordered in ED Medications - No data to display  ED Course/ Medical Decision Making/ A&P   {    Click here for ABCD2, HEART and other calculatorsREFRESH Note before signing :1}                          Medical Decision Making Amount and/or Complexity of Data Reviewed Labs: ordered.   This patient presents to the ED with concern for syncope versus near syncope. This involves an extensive number of treatment options, and is a complaint that carries with it a high risk of complications and morbidity.  The differential diagnosis includes heat exhaustion most likely with electrolyte derangement versus anemia versus arrhythmia versus other  I ordered imaging studies including CT head and cervical spine I independently visualized and interpreted imaging which showed no emergent findings I agree with the radiologist interpretation  The patient was maintained on a cardiac monitor.  I personally viewed and interpreted the cardiac monitored which showed an underlying rhythm of: Sinus rhythm  Per my interpretation the  patient's ECG shows ***  I have reviewed the patients home medicines and have made adjustments as needed  Test Considered: Doubt acute PE, PERC negative.  Doubt ACS, CVA  After the interventions noted above, I reevaluated the patient and found that they have: improved  Social Determinants of Health: I strongly encouraged the patient to stay hydrated with water, to avoid energy drinks, which can worsen dehydration.  We also discussed how to stay cool and out of the heat.  He verbalized understanding.  Dispostion:  After consideration of the diagnostic results and the patients response to treatment, I feel that the patent would benefit from close outpatient follow-up.  At this time the patient has demonstrated stable vital signs, and feels back to baseline, after nearly 6 hours in the emergency department.  I suspect this episode is consistent with heat exhaustion.  He is not requiring hospitalization for this issue.  But a work note will be provided, and he will stay out of the  heat and be more mindful of this moving forward.  If he has further syncope or near syncope episodes when he is not exposed to the heat he needs to return to the hospital.  I offered to use a [place] translator from a history and exam but the patient declined, preferring to speak in [place].   {Document critical care time when appropriate:1} {Document review of labs and clinical decision tools ie heart score, Chads2Vasc2 etc:1}  {Document your independent review of radiology images, and any outside records:1} {Document your discussion with family members, caretakers, and with consultants:1} {Document social determinants of health affecting pt's care:1} {Document your decision making why or why not admission, treatments were needed:1} Final Clinical Impression(s) / ED Diagnoses Final diagnoses:  None    Rx / DC Orders ED Discharge Orders     None

## 2022-11-06 NOTE — ED Triage Notes (Signed)
Patient arrives from work where he was approximately four feet off the ground on a ladder when he began to feel hot so he turned the hedge trimmers off to take a break. Next, he woke up face down on the ground with one of his legs hanging from the ladder. Complains of mid back pain and not feeling himself. Denies head or neck pain. A/O x 4 in triage.

## 2023-07-17 ENCOUNTER — Ambulatory Visit (INDEPENDENT_AMBULATORY_CARE_PROVIDER_SITE_OTHER): Payer: Self-pay

## 2023-07-17 ENCOUNTER — Ambulatory Visit (HOSPITAL_COMMUNITY)
Admission: EM | Admit: 2023-07-17 | Discharge: 2023-07-17 | Disposition: A | Discharge status: Home / Self Care | Payer: Self-pay

## 2023-07-17 ENCOUNTER — Encounter (HOSPITAL_COMMUNITY): Payer: Self-pay

## 2023-07-17 DIAGNOSIS — B349 Viral infection, unspecified: Secondary | ICD-10-CM

## 2023-07-17 LAB — POCT RAPID STREP A (OFFICE): Rapid Strep A Screen: NEGATIVE

## 2023-07-17 LAB — POC COVID19/FLU A&B COMBO
Covid Antigen, POC: NEGATIVE
Influenza A Antigen, POC: NEGATIVE
Influenza B Antigen, POC: NEGATIVE

## 2023-07-17 MED ORDER — IBUPROFEN 600 MG PO TABS: 600.0000 mg | ORAL_TABLET | Freq: Four times a day (QID) | ORAL | 0 refills | Status: AC | PRN

## 2023-07-17 MED ORDER — AZELASTINE HCL 0.1 % NA SOLN: 1.0000 | Freq: Two times a day (BID) | NASAL | 1 refills | Status: AC

## 2023-07-17 NOTE — ED Triage Notes (Signed)
 Pt c/o fever, cough, sore throat, and bilateral ear pain x4 days. States taking dayquil with relief.  Pt c/o sweating when sleeping for past 3 months.

## 2023-07-17 NOTE — Discharge Instructions (Addendum)
 1. Viral syndrome (Primary) - POC Covid19/Flu A&B Antigen negative for COVID and influenza - POC rapid strep A negative for strep pharyngitis - DG Chest 2 View x-ray shows no acute cardiopulmonary processes, no sign of consolidation or pneumonia - azelastine (ASTELIN) 0.1 % nasal spray; Place 1 spray into both nostrils 2 (two) times daily. Use in each nostril as directed  Dispense: 30 mL; Refill: 1 - Ambulatory referral to Internal Medicine for ongoing medical management and laboratory testing. - ibuprofen (ADVIL) 600 MG tablet; Take 1 tablet (600 mg total) by mouth every 6 (six) hours as needed.  Dispense: 30 tablet; Refill: 0 -Continue to monitor symptoms take medications as directed if any escalation of current symptoms or development of new symptoms follow-up for further evaluation with primary care.

## 2023-07-17 NOTE — ED Provider Notes (Signed)
 [facility] [place]  Note:  This document was prepared using Dragon voice recognition software and may include unintentional dictation errors.  MRN: [id number] DOB: 09/09/1975  Subjective:   William Blankenship is a 48 y.o. male presenting for fever, cough, nasal congestion, bilateral ear pressure, sore throat x 3 to 4 days and sweating at night x 2 to 3 months.  Patient reports he has taken some Tylenol for fever and pain and DayQuil for sore throat and congestion with mild improvement.  Denies any known sick contacts.  No shortness of breath, chest pain, weakness, dizziness.  Patient states that sweating at night causes him to wake up and have to change his shirt because he is completely saturated with sweat.  Patient denies having a primary care provider and would like resources for follow-up.  No current facility-administered medications for this encounter.  Current Outpatient Medications:    azelastine (ASTELIN) 0.1 % nasal spray, Place 1 spray into both nostrils 2 (two) times daily. Use in each nostril as directed, Disp: 30 mL, Rfl: 1   Cetirizine HCl 10 MG CAPS, Take 1 capsule (10 mg total) by mouth daily for 10 days., Disp: 10 capsule, Rfl: 0   fluticasone (FLONASE) 50 MCG/ACT nasal spray, Place 1-2 sprays into both nostrils daily for 7 days., Disp: 1 g, Rfl: 0   ibuprofen (ADVIL) 600 MG tablet, Take 1 tablet (600 mg total) by mouth every 6 (six) hours as needed., Disp: 30 tablet, Rfl: 0   No Known Allergies  Past Medical History:  Diagnosis Date   Abrasion of anterior lower leg 07/29/2012   Hand injury 03/31/2013   PATELLAR DISLOCATION, LEFT 01/31/2010   Qualifier: Diagnosis of  By: Jennette Kettle MD, [place] Dec       History reviewed. No pertinent surgical history.  Family History  Problem Relation Age of Onset   Healthy Mother     Social History   Tobacco Use   Smoking status: Passive Smoke Exposure - Never Smoker   Smokeless tobacco: Never  Vaping Use   Vaping status: Never  Used  Substance Use Topics   Alcohol use: Yes   Drug use: No    ROS Refer to HPI for ROS details.  Objective:   Vitals: BP 112/73 (BP Location: Left Arm)   Pulse 76   Temp 98.3 F (36.8 C) (Oral)   Resp 18   SpO2 96%   Physical Exam Vitals and nursing note reviewed.  Constitutional:      General: He is not in acute distress.    Appearance: Normal appearance. He is well-developed. He is not ill-appearing, toxic-appearing or diaphoretic.  HENT:     Head: Normocephalic.     Right Ear: Tympanic membrane, ear canal and external ear normal.     Left Ear: Tympanic membrane, ear canal and external ear normal.     Nose: Congestion and rhinorrhea present.     Mouth/Throat:     Mouth: Mucous membranes are moist.     Pharynx: No oropharyngeal exudate or posterior oropharyngeal erythema.  Eyes:     Extraocular Movements: Extraocular movements intact.     Conjunctiva/sclera: Conjunctivae normal.  Cardiovascular:     Rate and Rhythm: Normal rate and regular rhythm.     Heart sounds: No murmur heard. Pulmonary:     Effort: Pulmonary effort is normal. No respiratory distress.     Breath sounds: Normal breath sounds. No stridor. No wheezing, rhonchi or rales.  Musculoskeletal:     Cervical back: Neck supple. No  rigidity or tenderness.  Lymphadenopathy:     Cervical: No cervical adenopathy.  Skin:    General: Skin is warm and dry.  Neurological:     General: No focal deficit present.     Mental Status: He is alert and oriented to person, place, and time.  Psychiatric:        Mood and Affect: Mood normal.        Behavior: Behavior normal.     Procedures  Results for orders placed or performed during the hospital encounter of 07/17/23 (from the past 24 hours)  POC rapid strep A     Status: None   Collection Time: 07/17/23 11:16 AM  Result Value Ref Range   Rapid Strep A Screen Negative Negative  POC Covid19/Flu A&B Antigen     Status: None   Collection Time: 07/17/23 11:21  AM  Result Value Ref Range   Influenza A Antigen, POC Negative Negative   Influenza B Antigen, POC Negative Negative   Covid Antigen, POC Negative Negative    Assessment and Plan :   PDMP not reviewed this encounter.  1. Viral syndrome    1. Viral syndrome (Primary) - POC Covid19/Flu A&B Antigen negative for COVID and influenza - POC rapid strep A negative for strep pharyngitis - DG Chest 2 View x-ray shows no acute cardiopulmonary processes, no sign of consolidation or pneumonia - azelastine (ASTELIN) 0.1 % nasal spray; Place 1 spray into both nostrils 2 (two) times daily. Use in each nostril as directed  Dispense: 30 mL; Refill: 1 - Ambulatory referral to Internal Medicine for ongoing medical management and laboratory testing. - ibuprofen (ADVIL) 600 MG tablet; Take 1 tablet (600 mg total) by mouth every 6 (six) hours as needed.  Dispense: 30 tablet; Refill: 0 -Continue to monitor symptoms take medications as directed if any escalation of current symptoms or development of new symptoms follow-up for further evaluation with primary care.  Lucky Cowboy   [place], [place] B, [place] 07/17/23 1128

## 2023-12-24 ENCOUNTER — Other Ambulatory Visit: Payer: Self-pay

## 2023-12-24 ENCOUNTER — Encounter (HOSPITAL_COMMUNITY): Payer: Self-pay

## 2023-12-24 ENCOUNTER — Encounter (HOSPITAL_COMMUNITY): Payer: Self-pay | Admitting: Emergency Medicine

## 2023-12-24 ENCOUNTER — Emergency Department (HOSPITAL_COMMUNITY): Payer: Self-pay

## 2023-12-24 ENCOUNTER — Ambulatory Visit (HOSPITAL_COMMUNITY)
Admission: EM | Admit: 2023-12-24 | Discharge: 2023-12-24 | Disposition: A | Discharge status: Home / Self Care | Payer: Self-pay | Attending: Family Medicine | Admitting: Family Medicine

## 2023-12-24 ENCOUNTER — Emergency Department (HOSPITAL_COMMUNITY)
Admission: EM | Admit: 2023-12-24 | Discharge: 2023-12-24 | Disposition: A | Discharge status: Home / Self Care | Payer: Self-pay | Attending: Emergency Medicine | Admitting: Emergency Medicine

## 2023-12-24 DIAGNOSIS — R634 Abnormal weight loss: Secondary | ICD-10-CM

## 2023-12-24 DIAGNOSIS — D649 Anemia, unspecified: Secondary | ICD-10-CM | POA: Insufficient documentation

## 2023-12-24 DIAGNOSIS — G8929 Other chronic pain: Secondary | ICD-10-CM

## 2023-12-24 DIAGNOSIS — R61 Generalized hyperhidrosis: Secondary | ICD-10-CM

## 2023-12-24 DIAGNOSIS — R1903 Right lower quadrant abdominal swelling, mass and lump: Secondary | ICD-10-CM | POA: Insufficient documentation

## 2023-12-24 DIAGNOSIS — R1031 Right lower quadrant pain: Secondary | ICD-10-CM

## 2023-12-24 LAB — COMPREHENSIVE METABOLIC PANEL WITH GFR
ALT: 20 U/L (ref 0–44)
AST: 14 U/L — ABNORMAL LOW (ref 15–41)
Albumin: 2.9 g/dL — ABNORMAL LOW (ref 3.5–5.0)
Alkaline Phosphatase: 62 U/L (ref 38–126)
Anion gap: 9 (ref 5–15)
BUN: 14 mg/dL (ref 6–20)
CO2: 23 mmol/L (ref 22–32)
Calcium: 8.6 mg/dL — ABNORMAL LOW (ref 8.9–10.3)
Chloride: 106 mmol/L (ref 98–111)
Creatinine, Ser: 1.04 mg/dL (ref 0.61–1.24)
GFR, Estimated: >60 mL/min (ref >60)
Glucose, Bld: 94 mg/dL (ref 70–99)
Potassium: 4.3 mmol/L (ref 3.5–5.1)
Sodium: 138 mmol/L (ref 135–145)
Total Bilirubin: 0.5 mg/dL (ref 0.0–1.2)
Total Protein: 6.8 g/dL (ref 6.5–8.1)

## 2023-12-24 LAB — URINALYSIS, ROUTINE W REFLEX MICROSCOPIC
Bilirubin Urine: NEGATIVE
Glucose, UA: NEGATIVE mg/dL
Hgb urine dipstick: NEGATIVE
Ketones, ur: NEGATIVE mg/dL
Leukocytes,Ua: NEGATIVE
Nitrite: NEGATIVE
Protein, ur: NEGATIVE mg/dL
Specific Gravity, Urine: >1.046 — ABNORMAL HIGH (ref 1.005–1.030)
pH: 5 (ref 5.0–8.0)

## 2023-12-24 LAB — CBC
HCT: 34 % — ABNORMAL LOW (ref 39.0–52.0)
Hemoglobin: 9.9 g/dL — ABNORMAL LOW (ref 13.0–17.0)
MCH: 20.5 pg — ABNORMAL LOW (ref 26.0–34.0)
MCHC: 29.1 g/dL — ABNORMAL LOW (ref 30.0–36.0)
MCV: 70.2 fL — ABNORMAL LOW (ref 80.0–100.0)
Platelets: 586 K/uL — ABNORMAL HIGH (ref 150–400)
RBC: 4.84 MIL/uL (ref 4.22–5.81)
RDW: 19.6 % — ABNORMAL HIGH (ref 11.5–15.5)
WBC: 14.5 K/uL — ABNORMAL HIGH (ref 4.0–10.5)
nRBC: 0 % (ref 0.0–0.2)

## 2023-12-24 LAB — I-STAT CHEM 8, ED
BUN: 14 mg/dL (ref 6–20)
Calcium, Ion: 1.22 mmol/L (ref 1.15–1.40)
Chloride: 104 mmol/L (ref 98–111)
Creatinine, Ser: 1 mg/dL (ref 0.61–1.24)
Glucose, Bld: 90 mg/dL (ref 70–99)
HCT: 35 % — ABNORMAL LOW (ref 39.0–52.0)
Hemoglobin: 11.9 g/dL — ABNORMAL LOW (ref 13.0–17.0)
Potassium: 4.2 mmol/L (ref 3.5–5.1)
Sodium: 138 mmol/L (ref 135–145)
TCO2: 24 mmol/L (ref 22–32)

## 2023-12-24 LAB — POCT URINALYSIS DIP (MANUAL ENTRY)
Blood, UA: NEGATIVE
Glucose, UA: NEGATIVE mg/dL
Ketones, POC UA: NEGATIVE mg/dL
Leukocytes, UA: NEGATIVE
Nitrite, UA: NEGATIVE
Protein Ur, POC: 100 mg/dL — AB
Spec Grav, UA: >=1.03 — AB (ref 1.010–1.025)
Urobilinogen, UA: 0.2 U/dL
pH, UA: 5.5 (ref 5.0–8.0)

## 2023-12-24 LAB — TYPE AND SCREEN
ABO/RH(D): O POS
Antibody Screen: NEGATIVE

## 2023-12-24 LAB — LIPASE, BLOOD: Lipase: 24 U/L (ref 11–51)

## 2023-12-24 MED ORDER — OXYCODONE HCL 5 MG PO TABS
5.0000 mg | ORAL_TABLET | ORAL | 0 refills | Status: DC | PRN
Start: 2023-12-24 — End: 2024-01-04

## 2023-12-24 MED ORDER — IOHEXOL 350 MG/ML SOLN
75.0000 mL | Freq: Once | INTRAVENOUS | Status: AC | PRN
  Administered 2023-12-24: 75 mL via INTRAVENOUS

## 2023-12-24 NOTE — ED Notes (Signed)
 Patient is being discharged from the Urgent Care and sent to the Emergency Department via POV . Per Sharlet Linden, MD, patient is in need of higher level of care due to eval abdominal pain, . Patient is aware and verbalizes understanding of plan of care.  Vitals:   12/24/23 0823  BP: 129/85  Pulse: 79  Resp: 18  Temp: (!) 97.3 F (36.3 C)  SpO2: 98%

## 2023-12-24 NOTE — ED Triage Notes (Signed)
 Patient presents to the office for lower right side abdominal pain that radiates to his back x 3 months. Pain is worse with movement. Medication:Electrolytes balance pills.

## 2023-12-24 NOTE — ED Provider Notes (Addendum)
 [facility]    CSN: [id number] Arrival date & time: 12/24/23  0805      History   Chief Complaint No chief complaint on file.   HPI William Blankenship is a 48 y.o. male.   HPI Here for right lower abdominal pain and weight loss.  The abdominal pain has been going on for about 3 months.  It is worse with movement.  He has lost about 22 pounds.  He has also had night sweats and subjective fevers.  Appetite has been reduced some but he is eating fairly normally he states.  No nausea or vomiting.  No blood in the stool.  He has had loose stools for the entire 3 months.  NKDA  Of note 1 year ago when he was in the emergency room for syncope or near syncope, he was mildly anemic with a hemoglobin of 11.1.  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 No pertinent surgical history.     Home Medications    Prior to Admission medications   Medication Sig Start Date End Date Taking? Authorizing Provider  azelastine  (ASTELIN ) 0.1 % nasal spray Place 1 spray into both nostrils 2 (two) times daily. Use in each nostril as directed 07/17/23   Reddick, Johnathan B, NP  Cetirizine  HCl  10 MG CAPS Take 1 capsule (10 mg total) by mouth daily for 10 days. 07/13/18 07/23/18  Wieters, Hallie C, PA-C  fluticasone  (FLONASE ) 50 MCG/ACT nasal spray Place 1-2 sprays into both nostrils daily for 7 days. 07/13/18 07/20/18  Wieters, Hallie C, PA-C  ibuprofen  (ADVIL ) 600 MG tablet Take 1 tablet (600 mg total) by mouth every 6 (six) hours as needed. 07/17/23   Aurea Ethel NOVAK, NP    Family History Family History  Problem Relation Age of Onset   Healthy Mother     Social History Social History   Tobacco Use   Smoking status: Passive Smoke Exposure - Never Smoker   Smokeless tobacco: Never  Vaping Use   Vaping status: Never Used  Substance Use Topics   Alcohol use: Yes   Drug use: No     Allergies   Patient has no known allergies.   Review of Systems Review of Systems   Physical Exam Triage Vital Signs ED Triage Vitals  Encounter Vitals Group     BP 12/24/23 0823 129/85     Girls Systolic BP Percentile --      Girls Diastolic BP Percentile --      Boys Systolic BP Percentile --      Boys Diastolic BP Percentile --      Pulse Rate 12/24/23 0823 79  Resp 12/24/23 0823 18     Temp 12/24/23 0823 (!) 97.3 F (36.3 C)     Temp Source 12/24/23 0823 Oral     SpO2 12/24/23 0823 98 %     Weight --      Height --      Head Circumference --      Peak Flow --      Pain Score 12/24/23 0824 3     Pain Loc --      Pain Education --      Exclude from Growth Chart --    No data found.  Updated Vital Signs BP 129/85 (BP Location: Left Arm)   Pulse 79   Temp (!) 97.3 F (36.3 C) (Oral)   Resp 18   SpO2 98%   Visual Acuity Right Eye Distance:   Left Eye Distance:   Bilateral Distance:    Right Eye Near:   Left Eye Near:    Bilateral Near:     Physical Exam Vitals reviewed.  Constitutional:      General: He is not in acute distress.    Appearance: He is not ill-appearing, toxic-appearing or diaphoretic.  HENT:     Mouth/Throat:     Mouth: Mucous  membranes are moist.     Comments: There is some pallor of the mucous membranes. Eyes:     Extraocular Movements: Extraocular movements intact.     Conjunctiva/sclera: Conjunctivae normal.     Pupils: Pupils are equal, round, and reactive to light.  Cardiovascular:     Rate and Rhythm: Normal rate and regular rhythm.     Heart sounds: No murmur heard. Pulmonary:     Effort: Pulmonary effort is normal.     Breath sounds: Normal breath sounds.  Abdominal:     Palpations: Abdomen is soft.     Comments: He is tender over the right lower quadrant and right periumbilical area.  I think I can feel some mass or liver edge in the right upper quadrant.  That is not tender.  Bowel sounds are hyperactive.  Musculoskeletal:     Cervical back: Neck supple.  Lymphadenopathy:     Cervical: No cervical adenopathy.  Skin:    Coloration: Skin is not jaundiced.  Neurological:     General: No focal deficit present.     Mental Status: He is alert and oriented to person, place, and time.  Psychiatric:        Behavior: Behavior normal.      UC Treatments / Results  Labs (all labs ordered are listed, but only abnormal results are displayed) Labs Reviewed  POCT URINALYSIS DIP (MANUAL ENTRY) - Abnormal; Notable for the following components:      Result Value   Color, UA straw (*)    Bilirubin, UA small (*)    Spec Grav, UA >=1.030 (*)    Protein Ur, POC =100 (*)    All other components within normal limits    EKG   Radiology No results found.  Procedures Procedures (including critical care time)  Medications Ordered in UC Medications - No data to display  Initial Impression / Assessment and Plan / UC Course  I have reviewed the triage vital signs and the nursing notes.  Pertinent labs & imaging results that were available during my care of the patient were reviewed by me and considered in my medical decision making (see chart for details).    Urinalysis is concentrated but has no  abnormality other than normal.  I think he needs evaluation for serious symptoms that could indicate an intra-abdominal cancer or other serious pathology.  He does not have insurance and I do not think he will be able to get this evaluation done in a timely manner outpatient with no coverage.  I have asked him to proceed to the emergency room for further evaluation that we cannot provide here in the urgent care setting.  He is agreeable and will go to the emergency room now. Final Clinical Impressions(s) / UC Diagnoses   Final diagnoses:  Loss of weight  Abdominal pain, chronic, right lower quadrant  Night sweats     Discharge Instructions      Please go to the emergency room to be evaluated for your serious symptoms.    ED Prescriptions   None    PDMP not reviewed this encounter.   Vonna Sharlet POUR, MD 12/24/23 [phone]    Vonna Sharlet POUR, MD 12/24/23 [phone]

## 2023-12-24 NOTE — ED Notes (Signed)
 Patient transported to CT

## 2023-12-24 NOTE — Discharge Instructions (Signed)
 Please go to the emergency room to be evaluated for your serious symptoms.

## 2023-12-24 NOTE — ED Provider Notes (Signed)
  [place] EMERGENCY DEPARTMENT AT [facility] Provider Note   CSN: [id number] Arrival date & time: 12/24/23  9082     Patient presents with: Abdominal Pain   William Blankenship is a 48 y.o. male.  {Add pertinent medical, surgical, social history, OB history to HPI:32947}  Abdominal Pain      Prior to Admission medications   Medication Sig Start Date End Date Taking? Authorizing Provider  azelastine  (ASTELIN ) 0.1 % nasal spray Place 1 spray into both nostrils 2 (two) times daily. Use in each nostril as directed 07/17/23   Reddick, Johnathan B, NP  Cetirizine  HCl 10 MG CAPS Take 1 capsule (10 mg total) by mouth daily for 10 days. 07/13/18 07/23/18  Wieters, Hallie C, PA-C  fluticasone  (FLONASE ) 50 MCG/ACT nasal spray Place 1-2 sprays into both nostrils daily for 7 days. 07/13/18 07/20/18  Wieters, Hallie C, PA-C  ibuprofen  (ADVIL ) 600 MG tablet Take 1 tablet (600 mg total) by mouth every 6 (six) hours as needed. 07/17/23   Reddick, Johnathan B, NP    Allergies: Patient has no known allergies.    Review of Systems  Gastrointestinal:  Positive for abdominal pain.    Updated Vital Signs BP 129/77   Pulse 63   Temp (!) 97.5 F (36.4 C) (Oral)   Resp 13   Ht 5' 5 (1.651 m)   Wt 62.7 kg   SpO2 100%   BMI 23.00 kg/m   Physical Exam  (all labs ordered are listed, but only abnormal results are displayed) Labs Reviewed  CBC - Abnormal; Notable for the following components:      Result Value   WBC 14.5 (*)    Hemoglobin 9.9 (*)    HCT 34.0 (*)    MCV 70.2 (*)    MCH 20.5 (*)    MCHC 29.1 (*)    RDW 19.6 (*)    Platelets 586 (*)    All other components within normal limits  I-STAT CHEM 8, ED - Abnormal; Notable for the following components:   Hemoglobin 11.9 (*)    HCT 35.0 (*)    All other components within normal limits  LIPASE, BLOOD  COMPREHENSIVE METABOLIC PANEL WITH GFR  URINALYSIS, ROUTINE W REFLEX MICROSCOPIC    EKG: None  Radiology: No  results found.  {Document cardiac monitor, telemetry assessment procedure when appropriate:32947} Procedures   Medications Ordered in the ED - No data to display    {Click here for ABCD2, HEART and other calculators REFRESH Note before signing:1}                              Medical Decision Making Amount and/or Complexity of Data Reviewed Labs: ordered.   ***  {Document critical care time when appropriate  Document review of labs and clinical decision tools ie CHADS2VASC2, etc  Document your independent review of radiology images and any outside records  Document your discussion with family members, caretakers and with consultants  Document social determinants of health affecting pt's care  Document your decision making why or why not admission, treatments were needed:32947:::1}   Final diagnoses:  None    ED Discharge Orders     None

## 2023-12-24 NOTE — Discharge Instructions (Addendum)
 You were seen in the emergency department today for evaluation of your symptoms.  On your scan, there was a large mass seen.  This is highly suspicious for cancer.  You will need to follow-up with an oncologist.  I have included the information for one into the discharge paperwork.  Please make sure that you call to schedule an appointment.  They should reach out to you however.  For pain, recommend taking 1000 g of Tylenol and/or 600 mg ibuprofen  every 6 hours as needed for pain.  I have also prescribed you some stronger pain medication to take for breakthrough pain.  Please do not drive or operate machinery while on this medication as it will make you sleepy.  I have attached additional information to the you for work for you to review.  If you have any concerns, new or worsening symptoms, please return to your nearest emergency department for reevaluation.  Hoy lo atendieron en urgencias para evaluar sus sntomas. En la tomografa, se observ una masa grande. Esto es altamente sospechoso de cncer. Deber consultar con un onclogo. Inclu la informacin de uno en la documentacin del alta. Por favor, asegrese de llamar para programar una cita. Sin embargo, Doctor, hospital. Para el dolor, le recomiendo tomar 1000 g de Tylenol o 600 mg de ibuprofeno cada 6 horas, segn sea necesario. Tambin le he recetado un analgsico ms fuerte para Chief Technology Officer irruptivo. No conduzca ni opere maquinaria mientras est tomando [employer], ya que le causar somnolencia. He adjuntado informacin adicional a su formulario de trabajo para que la revise. Si tiene jersey inquietud, sntomas nuevos o que [place], por favor, regrese al servicio de urgencias ms cercano para una reevaluacin.  Comunquese con un mdico si: El dolor de estmago cambia  o empeora. Tiene clicos muy intensos o mucha distensin en el vientre. Vomita. El dolor empeora con las comidas, despus de comer o con determinados alimentos. Tiene  dificultades para defecar o produce heces lquidas durante ms de 2 o 3 das. No tiene apetito o baja de peso sin proponrselo. Presenta signos de no tener suficientes lquido o agua en el cuerpo (deshidratacin). Pueden incluir: Orina oscura, muy escasa o falta de comoros. Labios agrietados o Building surveyor. Somnolencia o debilidad. Siente dolor al orinar o defecar. El dolor de estmago lo despierta de noche. Observa sangre en la orina. Tiene fiebre. Solicite ayuda de inmediato si: No puede dejar de vomitar. Siente Chief Technology Officer en una sola parte del vientre, por ejemplo, en el lado derecho. Tiene heces con sangre, de color negro o con aspecto alquitranado. Tiene dificultad para respirar. Tiene dolor en el pecho. Estos sntomas pueden Customer service manager. Solicite ayuda de inmediato. Llame al 911. No espere a ver si los sntomas desaparecen. No conduzca por sus propios medios [employer].

## 2023-12-24 NOTE — ED Triage Notes (Signed)
 Pt reports right sided abd pain for 3 months that worsened Friday. Also having diarrhea for same amount of time. States pain worse with cough or trying to deep breathe. Reports weight loss of about 20 pounds.

## 2023-12-27 ENCOUNTER — Inpatient Hospital Stay: Payer: Self-pay | Attending: Hematology | Admitting: Hematology

## 2023-12-27 ENCOUNTER — Inpatient Hospital Stay: Payer: Self-pay

## 2023-12-27 ENCOUNTER — Encounter: Payer: Self-pay | Admitting: Hematology

## 2023-12-27 ENCOUNTER — Inpatient Hospital Stay: Payer: Self-pay | Admitting: Licensed Clinical Social Worker

## 2023-12-27 VITALS — BP 120/64 | HR 91 | Temp 98.7°F | Resp 18 | Ht 65.0 in | Wt 139.8 lb

## 2023-12-27 DIAGNOSIS — D509 Iron deficiency anemia, unspecified: Secondary | ICD-10-CM | POA: Insufficient documentation

## 2023-12-27 DIAGNOSIS — R109 Unspecified abdominal pain: Secondary | ICD-10-CM | POA: Insufficient documentation

## 2023-12-27 DIAGNOSIS — R197 Diarrhea, unspecified: Secondary | ICD-10-CM | POA: Insufficient documentation

## 2023-12-27 DIAGNOSIS — K6389 Other specified diseases of intestine: Secondary | ICD-10-CM | POA: Insufficient documentation

## 2023-12-27 DIAGNOSIS — R634 Abnormal weight loss: Secondary | ICD-10-CM | POA: Insufficient documentation

## 2023-12-27 DIAGNOSIS — D75838 Other thrombocytosis: Secondary | ICD-10-CM | POA: Insufficient documentation

## 2023-12-27 DIAGNOSIS — R61 Generalized hyperhidrosis: Secondary | ICD-10-CM | POA: Insufficient documentation

## 2023-12-27 DIAGNOSIS — E46 Unspecified protein-calorie malnutrition: Secondary | ICD-10-CM | POA: Insufficient documentation

## 2023-12-27 NOTE — Progress Notes (Signed)
 PATIENT NAVIGATOR PROGRESS NOTE  Name: William Blankenship Date: 12/27/2023 MRN: [id number]  DOB: 09-19-1975   Reason for visit:  New patient appt with Dr. Onita Mattock  Comments:   Patient was seen during his initial Med/Onc appt with Dr. Mattock.  Patient was accompanied to visit by his son. Patient was given my direct contact information and was instructed to contact office with any questions or concerns after today's visit.   A Nutrition referral was entered. Patient was able to see Devere Manna, Social Worker during today's visit.    Will follow-up with patient regarding referrals to GI and Surgery.   Time spent counseling/coordinating care: > 60 minutes

## 2023-12-27 NOTE — Progress Notes (Signed)
 [facility]   [phone] [phone]   Clinic New Consult Note   Patient Care Team: Pcp, No as PCP - General 12/27/2023  CHIEF COMPLAINTS/PURPOSE OF CONSULTATION:  Ascending colon mass  REFERRING PHYSICIAN: ED  Discussed the use of AI scribe software for clinical note transcription with the patient, who gave verbal consent to proceed.  History of Present Illness William Blankenship is a 48 year old male who presents with abdominal pain and weight loss. He is accompanied by his son.  He was referred by ED physician.   He has experienced intermittent right sided abdominal pain for one year, with increased constancy over the past three months. The pain is primarily on the right side, sometimes radiating to the left, described as 'knife-like', with intensity ranging from 4 to 6 at rest and escalating to 10 with movement. Physical activity exacerbates the pain.  Due to the worsening pain, he went to urgent care on December 24, 2023, and was subsequently referred to emergency room.  CT abdomen pelvis with contrast showed a large annular soft tissue mass in the ascending colon measuring 8 cm in length, no evidence of bowel obstruction.  CT scan also showed right ileal colonic and mesenteric lymphadenopathy with largest node measuring 1.5 cm.  No evidence of distant metastasis.  Over the past three months, he has lost approximately 22 pounds without dietary changes or increased activity. He reports decreased appetite, consuming about three-quarters of his usual intake.  His energy level has slightly decreased, but still able to work.  He does Aeronautical engineer.  He experiences persistent diarrhea, with bowel movements up to 13 times a day during episodes, and occasional thin, pencil-like stools. He does not regularly check for blood in his stool but did not notice any recently.  He reports episodes of night sweats and feverish feelings over the past three months, which  have decreased in frequency. He also experiences fatigue, particularly in the afternoons.  He was informed of anemia during a recent emergency room visit. There is no known family history of cancer, and he has no information about his biological father's medical history.  No family history of malignancy.  He does drink alcohol a few times a week, does not smoke.  He is married to live with his wife and 3 children.   MEDICAL HISTORY:  Past Medical History:  Diagnosis Date   Abrasion of anterior lower leg 07/29/2012   Hand injury 03/31/2013   PATELLAR DISLOCATION, LEFT 01/31/2010   Qualifier: Diagnosis of  By: Rosalynn MD, Camie      SURGICAL HISTORY: No past surgical history on file.  SOCIAL HISTORY: Social History   Socioeconomic History   Marital status: Married    Spouse name: Not on file   Number of children: 3   Years of education: Not on file   Highest education level: Not on file  Occupational History   Not on file  Tobacco Use   Smoking status: Passive Smoke Exposure - Never Smoker   Smokeless tobacco: Never  Vaping Use   Vaping status: Never Used  Substance and Sexual Activity   Alcohol use: Yes    Comment: once every a few weeks   Drug use: No   Sexual activity: Not on file  Other Topics Concern   Not on file  Social History Narrative   Not on file   Social Drivers of Health   Financial Resource Strain: Not on file  Food Insecurity: No Food Insecurity (12/27/2023)  Hunger Vital Sign    Worried About Running Out of Food in the Last Year: Never true    Ran Out of Food in the Last Year: Never true  Transportation Needs: No Transportation Needs (12/27/2023)   PRAPARE - Administrator, Civil Service (Medical): No    Lack of Transportation (Non-Medical): No  Physical Activity: Not on file  Stress: Not on file  Social Connections: Not on file  Intimate Partner Violence: Not At Risk (12/27/2023)   Humiliation, Afraid, Rape, and Kick questionnaire    Fear of  Current or Ex-Partner: No    Emotionally Abused: No    Physically Abused: No    Sexually Abused: No    FAMILY HISTORY: No family history on file.  ALLERGIES:  has no known allergies.  MEDICATIONS:  Current Outpatient Medications  Medication Sig Dispense Refill   azelastine  (ASTELIN ) 0.1 % nasal spray Place 1 spray into both nostrils 2 (two) times daily. Use in each nostril as directed 30 mL 1   Cetirizine  HCl 10 MG CAPS Take 1 capsule (10 mg total) by mouth daily for 10 days. 10 capsule 0   fluticasone  (FLONASE ) 50 MCG/ACT nasal spray Place 1-2 sprays into both nostrils daily for 7 days. 1 g 0   ibuprofen  (ADVIL ) 600 MG tablet Take 1 tablet (600 mg total) by mouth every 6 (six) hours as needed. 30 tablet 0   oxyCODONE  (ROXICODONE ) 5 MG immediate release tablet Take 1 tablet (5 mg total) by mouth every 4 (four) hours as needed for severe pain (pain score 7-10). 15 tablet 0   No current facility-administered medications for this visit.    REVIEW OF SYSTEMS:   Constitutional: Denies fevers, chills or abnormal night sweats Eyes: Denies blurriness of vision, double vision or watery eyes Ears, nose, mouth, throat, and face: Denies mucositis or sore throat Respiratory: Denies cough, dyspnea or wheezes Cardiovascular: Denies palpitation, chest discomfort or lower extremity swelling Gastrointestinal:  Denies nausea, heartburn or change in bowel habits Skin: Denies abnormal skin rashes Lymphatics: Denies new lymphadenopathy or easy bruising Neurological:Denies numbness, tingling or new weaknesses Behavioral/Psych: Mood is stable, no new changes  All other systems were reviewed with the patient and are negative.  PHYSICAL EXAMINATION: ECOG PERFORMANCE STATUS: 1 - Symptomatic but completely ambulatory  Vitals:   12/27/23 1435  BP: 120/64  Pulse: 91  Resp: 18  Temp: 98.7 F (37.1 C)  SpO2: 98%   Filed Weights   12/27/23 1435  Weight: 139 lb 12.8 oz (63.4 kg)    GENERAL:alert,  no distress and comfortable SKIN: skin color, texture, turgor are normal, no rashes or significant lesions EYES: normal, conjunctiva are pink and non-injected, sclera clear OROPHARYNX:no exudate, no erythema and lips, buccal mucosa, and tongue normal  NECK: supple, thyroid normal size, non-tender, without nodularity LYMPH:  no palpable lymphadenopathy in the cervical, axillary or inguinal LUNGS: clear to auscultation and percussion with normal breathing effort HEART: regular rate & rhythm and no murmurs and no lower extremity edema ABDOMEN:abdomen soft, non-tender and normal bowel sounds Musculoskeletal:no cyanosis of digits and no clubbing  PSYCH: alert & oriented x 3 with fluent speech NEURO: no focal motor/sensory deficits  Physical Exam ABDOMEN: Tenderness in the mid-abdomen, no palpable mass.  LABORATORY DATA:  I have reviewed the data as listed    Latest Ref Rng & Units 12/24/2023    9:40 AM 12/24/2023    9:30 AM 11/06/2022   12:56 PM  CBC  WBC 4.0 -  10.5 K/uL  14.5  13.4   Hemoglobin 13.0 - 17.0 g/dL 88.0  9.9  88.8   Hematocrit 39.0 - 52.0 % 35.0  34.0  37.5   Platelets 150 - 400 K/uL  586  420     @cmpl @  RADIOGRAPHIC STUDIES: I have personally reviewed the radiological images as listed and agreed with the findings in the report. CT CHEST ABDOMEN PELVIS W CONTRAST Result Date: 12/24/2023 CLINICAL DATA:  Right-sided abdominal pain. Unexplained weight loss. Fever and night sweats. EXAM: CT CHEST, ABDOMEN, AND PELVIS WITH CONTRAST TECHNIQUE: Multidetector CT imaging of the chest, abdomen and pelvis was performed following the standard protocol during bolus administration of intravenous contrast. RADIATION DOSE REDUCTION: This exam was performed according to the departmental dose-optimization program which includes automated exposure control, adjustment of the mA and/or kV according to patient size and/or use of iterative reconstruction technique. CONTRAST:  75mL OMNIPAQUE  IOHEXOL   350 MG/ML SOLN COMPARISON:  None Available. FINDINGS: CT CHEST FINDINGS Cardiovascular: No acute findings. Mediastinum/Lymph Nodes: No masses or pathologically enlarged lymph nodes identified. Lungs/Pleura: No suspicious pulmonary nodules or masses identified. No evidence of infiltrate or pleural effusion. Musculoskeletal:  No suspicious bone lesions identified. CT ABDOMEN AND PELVIS FINDINGS Hepatobiliary: No masses identified. Gallbladder is unremarkable. No evidence of biliary ductal dilatation. Pancreas:  No mass or inflammatory changes. Spleen:  Within normal limits in size and appearance. Adrenals/Urinary tract: No suspicious masses or hydronephrosis. Stomach/Bowel: An annular soft tissue mass is seen involving the ascending colon which measures approximately 8 cm in length, consistent with colon carcinoma. No evidence of obstruction, inflammatory process, or abnormal fluid collections. Vascular/Lymphatic: Mild pericolonic and right ileocolic mesenteric lymphadenopathy is seen, with largest lymph node measuring 15 mm on image 81/3. No other sites of lymphadenopathy identified. No acute vascular findings. Reproductive:  No mass or other significant abnormality identified. Other:  None. Musculoskeletal:  No suspicious bone lesions identified. IMPRESSION: Annular soft tissue mass involving the ascending colon, consistent with primary colon carcinoma. Mild pericolonic and right ileocolic mesenteric lymphadenopathy, consistent with metastatic disease. No evidence of distant metastatic disease. Electronically Signed   By: Norleen DELENA Kil M.D.   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  -The tumor has not caused complete bowel obstruction but may lead to obstruction if untreated. Significant weight loss and night sweats indicate systemic effects of the cancer. - Will make a urgent referral to [facility], for expedited colonoscopy for definitive diagnosis. - Refer to colorectal surgeon for evaluation and potential surgical intervention. - Start oral iron supplementation to address iron deficiency anemia. - Advise high protein diet to address protein-calorie malnutrition. - Consider Miralax for bowel regulation as needed. - Coordinate with dietitian for nutritional support. - Plan for potential chemotherapy post-surgery if lymph node involvement is confirmed. Discussed the potential need for chemotherapy to reduce recurrence risk. - Discussed the possibility of laparoscopic surgery for faster recovery (1-2 weeks) and shorter hospital stay (2-3 days).  Patient is very concerned about the time he needs to be out of work, due to his financial responsibility to support his family.  Anemia, likely iron deficient anemia - Hemoglobin 9.9 with low MCV 70.2, and reactive thrombocytosis - I recommend him to  start oral iron over-the-counter  Plan - Lab and CT scan images reviewed with patient and his son - I made urgent referral to [facility], no surgery for expedited colonoscopy and surgical evaluation - I recommend him to start oral iron - Follow-up after surgery, or sooner if needed      No orders of the defined types were placed in this encounter.   All questions were answered. The patient knows to call the clinic with any problems, questions or concerns. I spent 40 minutes counseling the patient face to face. The total time spent in the appointment was 50 minutes including review of chart and various tests results, discussions about plan of care and coordination of care plan.     Onita Mattock, MD 12/27/2023

## 2023-12-27 NOTE — Progress Notes (Signed)
 [facility] Clinical Social Work  Initial Assessment   William Blankenship is a 48 y.o. year old male accompanied by patient and son.  Interpreter was present to clarify any questions pt may have.  Clinical Social Work was referred by medical provider for assessment of psychosocial needs.   SDOH (Social Determinants of Health) assessments performed: Yes   SDOH Screenings   Food Insecurity: No Food Insecurity (12/27/2023)  Housing: Unknown (12/27/2023)  Transportation Needs: No Transportation Needs (12/27/2023)  Utilities: Not At Risk (12/27/2023)  Depression (PHQ2-9): Low Risk  (12/27/2023)  Tobacco Use: Medium Risk (12/24/2023)    PHQ 2/9:    12/27/2023    2:40 PM 05/05/2016    3:14 PM  Depression screen PHQ 2/9  Decreased Interest 0 0  Down, Depressed, Hopeless 0 0  PHQ - 2 Score 0 0     Distress Screen completed: No     No data to display            Family/Social Information:  Housing Arrangement: patient lives with his wife and 3 children ages 73, 13 and 21. Family members/support persons in your life? Pt has very limited support and is the only member of the household working presently. Transportation concerns: no  Employment: Working full time in Aeronautical engineer.  Income source: Employment Financial concerns: Yes, due to illness and/or loss of work during treatment Type of concern: Utilities, Government social research officer, and Medical bills Food access concerns: if pt is unable to work this will be a concern Religious or spiritual practice: Not known Advanced directives: No Services Currently in place:  none  Coping/ Adjustment to diagnosis: Patient understands treatment plan and what happens next? no, pt has not yet gone through diagnostics Concerns about diagnosis and/or treatment: Overwhelmed by information, How I will pay for the services I need, How will I care for myself, and Quality of life Patient reported stressors: Finances Hopes and/or priorities: pt's priority is to complete  diagnostics with the hope that any treatment needed will be simple and effective. Patient enjoys time with family/ friends Current coping skills/ strengths: Capable of independent living , Motivation for treatment/growth , and Supportive family/friends     SUMMARY: Current SDOH Barriers:  Financial constraints related to possible loss of income if unable to work and Limited social support  Clinical Social Work Clinical Goal(s):  Explore community resource options for unmet needs related to:  Financial Strain   Interventions: Discussed common feeling and emotions when being diagnosed with cancer, and the importance of support during treatment Informed patient of the support team roles and support services at [facility] Provided CSW contact information and encouraged patient to call with any questions or concerns Referred patient to community resources: Center for [employer] to assist w/ applying for an [employer].  Pt reports he had an [employer], but his health has been good and it expired.      Follow Up Plan: Once diagnostics are complete CSW to follow up w/ pt regarding resources based upon what treatment may be needed. Patient verbalizes understanding of plan: Yes    William JONELLE Manna, LCSW Clinical Social Worker [facility]

## 2024-01-01 ENCOUNTER — Encounter: Payer: Self-pay | Admitting: Hematology

## 2024-01-01 ENCOUNTER — Other Ambulatory Visit: Payer: Self-pay

## 2024-01-01 NOTE — Progress Notes (Signed)
 PATIENT NAVIGATOR PROGRESS NOTE  Name: William Blankenship Date: 01/01/2024 MRN: [id number]  DOB: May 18, 1975  Patient contacted office stating he has not yet been scheduled for colonoscopy but has been scheduled to meet with Surgeon on 9/15.  Patient was concerned that he would need colonoscopy results before seeing surgeon.  Informed patient that GI referral has been submitted and a follow-up message was sent this AM to GI.  Informed patient that it is ok for patient to be seen by surgery without colonoscopy/path results.  Informed patient his case will also be discussed during the mutidisciplinary conference on 9/10 and recommendations for next steps will be discussed with Surgeons and GI at that time.  Patient verbalized understanding to all.  Will follow-up with patient once I hear back from the GI office.     Time spent counseling/coordinating care: 15-30 minutes

## 2024-01-01 NOTE — Progress Notes (Signed)
 Returned call to patient whom left voicemail regarding financial assistance.  Left voicemail with my contact name and number. Patient currently not in treatment and will have surgery first.

## 2024-01-01 NOTE — Progress Notes (Signed)
 Second attempt to patient and was successful. Introduced myself as Dance movement psychotherapist and to answer his questions regarding financial assistance. He states he was going to receive paperwork to complete and submit for additional discount. Advised him to complete to its entirety and contact the number on application with any questions. He verbalized understanding.  Advised once he is in treatment with either chemotherapy or radiation, he may apply for one-time $1000 [employer] grant to assist with personal expenses while going through treatment. He verbalized understanding. He states he is waiting on call for colonoscopy and then surgeon.  In the meanwhile, encouraged patient to apply for assistance in the community with reduction of income.  He has my number for any additional financial questions or concerns.

## 2024-01-02 ENCOUNTER — Encounter: Payer: Self-pay | Admitting: Gastroenterology

## 2024-01-02 ENCOUNTER — Other Ambulatory Visit: Payer: Self-pay

## 2024-01-02 NOTE — Progress Notes (Signed)
 The proposed treatment discussed in conference is for discussion purpose only and is not a binding recommendation.  The patients have not been physically examined, or presented with their treatment options.  Therefore, final treatment plans cannot be decided.

## 2024-01-04 ENCOUNTER — Ambulatory Visit: Payer: Self-pay

## 2024-01-04 ENCOUNTER — Other Ambulatory Visit: Payer: Self-pay | Admitting: Oncology

## 2024-01-04 VITALS — Ht 65.0 in | Wt 138.0 lb

## 2024-01-04 DIAGNOSIS — Z1211 Encounter for screening for malignant neoplasm of colon: Secondary | ICD-10-CM

## 2024-01-04 MED ORDER — OXYCODONE HCL 5 MG PO TABS: 5.0000 mg | ORAL_TABLET | ORAL | 0 refills | Status: DC | PRN

## 2024-01-04 MED ORDER — PEG 3350-KCL-NA BICARB-NACL 420 G PO SOLR: 4000.0000 mL | Freq: Once | ORAL | 0 refills | Status: AC

## 2024-01-04 NOTE — Progress Notes (Unsigned)
 Patient to clinic I was contacted that he wanted to discuss his medication. After bringing patient to the office was found that he was Dr Lanny patient. Her nurse was contacted and her POD partner prescribed pain medication to get the patient thru the weekend. Explained to patient that when he sees his surgeon on Monday to ask for pain medication. Verbalized understanding. Arland Legions BSN RN

## 2024-01-04 NOTE — Progress Notes (Addendum)
 Pt's name and DOB verified at the beginning of the pre-visit with 2 identifiers  Permission given to speak with son present  Pt denies any difficulty with ambulating,sitting, laying down or rolling side to side  Pt has no issues moving head neck or swallowing  No egg or soy allergy known to patient   No issues known to pt with past sedation  No FH of Malignant Hyperthermia  Pt is not on home 02   Pt is not on blood thinners   Pt has frequent issues with constipation RN instructed pt to use Miralax per bottles instructions a week before prep days. Pt states they will  Pt is not on dialysis  Pt denise any abnormal heart rhythms   Pt denies any upcoming cardiac testing  Visit in person with interpretor for Spanish  Pt states weight is 138 lb   Pt given  both [facility] main # and MD on call # prior to instructions.  Informed pt to come in at the time discussed and is shown on PV instructions.  Pt instructed to use Singlecare.com or GoodRx for a price reduction on prep  Instructed pt where to find PV instructions in My Chart  Instructed pt on all aspects of written instructions including med holds clothing to wear and foods to eat and not eat as well as after procedure legal restrictions and to call MD on call if needed.. Pt states understanding. Instructed pt to review instructions again prior to procedure and call main # given if has any questions or any issues. Pt states they will.

## 2024-01-10 NOTE — Progress Notes (Signed)
 Sent message, via epic in basket, requesting orders in epic from Careers adviser.

## 2024-01-11 ENCOUNTER — Ambulatory Visit: Payer: Self-pay | Admitting: Surgery

## 2024-01-11 DIAGNOSIS — Z01818 Encounter for other preprocedural examination: Secondary | ICD-10-CM

## 2024-01-16 ENCOUNTER — Ambulatory Visit: Payer: Self-pay | Admitting: Gastroenterology

## 2024-01-16 ENCOUNTER — Encounter: Payer: Self-pay | Admitting: Gastroenterology

## 2024-01-16 VITALS — BP 93/55 | HR 74 | Temp 98.6°F | Resp 11 | Ht 65.0 in | Wt 138.0 lb

## 2024-01-16 DIAGNOSIS — Z1211 Encounter for screening for malignant neoplasm of colon: Secondary | ICD-10-CM

## 2024-01-16 DIAGNOSIS — D122 Benign neoplasm of ascending colon: Secondary | ICD-10-CM

## 2024-01-16 DIAGNOSIS — K5730 Diverticulosis of large intestine without perforation or abscess without bleeding: Secondary | ICD-10-CM

## 2024-01-16 DIAGNOSIS — K56691 Other complete intestinal obstruction: Secondary | ICD-10-CM

## 2024-01-16 DIAGNOSIS — C182 Malignant neoplasm of ascending colon: Secondary | ICD-10-CM

## 2024-01-16 DIAGNOSIS — K640 First degree hemorrhoids: Secondary | ICD-10-CM

## 2024-01-16 MED ORDER — SODIUM CHLORIDE 0.9 % IV SOLN: 500.0000 mL | Freq: Once | INTRAVENOUS | Status: DC

## 2024-01-16 NOTE — Patient Instructions (Addendum)
 REPEAT Colonoscopy in 1 year for surveillance.  CLEAR LIQUID DIET  YOU HAD AN ENDOSCOPIC PROCEDURE TODAY AT THE [facility]:   Refer to the procedure report that was given to you for any specific questions about what was found during the examination.  If the procedure report does not answer your questions, please call your gastroenterologist to clarify.  If you requested that your care partner not be given the details of your procedure findings, then the procedure report has been included in a sealed envelope for you to review at your convenience later.  YOU SHOULD EXPECT: Some feelings of bloating in the abdomen. Passage of more gas than usual.  Walking can help get rid of the air that was put into your GI tract during the procedure and reduce the bloating. If you had a lower endoscopy (such as a colonoscopy or flexible sigmoidoscopy) you may notice spotting of blood in your stool or on the toilet paper. If you underwent a bowel prep for your procedure, you may not have a normal bowel movement for a few days.  Please Note:  You might notice some irritation and congestion in your nose or some drainage.  This is from the oxygen used during your procedure.  There is no need for concern and it should clear up in a day or so.  SYMPTOMS TO REPORT IMMEDIATELY:  Following lower endoscopy (colonoscopy or flexible sigmoidoscopy):  Excessive amounts of blood in the stool  Significant tenderness or worsening of abdominal pains  Swelling of the abdomen that is new, acute  Fever of 100F or higher  For urgent or emergent issues, a gastroenterologist can be reached at any hour by calling [phone]. Do not use MyChart messaging for urgent concerns.    DIET:  CLEAR LIQUID DIET. Drink plenty of fluids but you should avoid alcoholic beverages for 24 hours.  ACTIVITY:  You should plan to take it easy for the rest of today and you should NOT DRIVE or use heavy machinery until tomorrow (because  of the sedation medicines used during the test).    FOLLOW UP: Our staff will call the number listed on your records the next business day following your procedure.  We will call around 7:15- 8:00 am to check on you and address any questions or concerns that you may have regarding the information given to you following your procedure. If we do not reach you, we will leave a message.     If any biopsies were taken you will be contacted by phone or by letter within the next 1-3 weeks.  Please call us  at [phone] if you have not heard about the biopsies in 3 weeks.    SIGNATURES/CONFIDENTIALITY: You and/or your care partner have signed paperwork which will be entered into your electronic medical record.  These signatures attest to the fact that that the information above on your After Visit Summary has been reviewed and is understood.  Full responsibility of the confidentiality of this discharge information lies with you and/or your care-partner.

## 2024-01-16 NOTE — Op Note (Signed)
 [facility] Patient Name: William Blankenship Procedure Date: 01/16/2024 2:24 PM MRN: [id number] Endoscopist: Aloha Finner , MD, [id number] Age: 48 Referring MD:  Date of Birth: 1976/02/27 Gender: Male Account #: [id number] Procedure:                Colonoscopy Indications:              This is the patient's first colonoscopy,                            Hematochezia, Abnormal CT of the GI tract Medicines:                Monitored Anesthesia Care Procedure:                Pre-Anesthesia Assessment:                           - Prior to the procedure, a History and Physical                            was performed, and patient medications and                            allergies were reviewed. The patient's tolerance of                            previous anesthesia was also reviewed. The risks                            and benefits of the procedure and the sedation                            options and risks were discussed with the patient.                            All questions were answered, and informed consent                            was obtained. Prior Anticoagulants: The patient has                            taken no anticoagulant or antiplatelet agents. ASA                            Grade Assessment: II - A patient with mild systemic                            disease. After reviewing the risks and benefits,                            the patient was deemed in satisfactory condition to                            undergo the procedure.  After obtaining informed consent, the colonoscope                            was passed under direct vision. Throughout the                            procedure, the patient's blood pressure, pulse, and                            oxygen saturations were monitored continuously. The                            Olympus Scope [id number] was introduced through the                            anus and  advanced to the the ascending colon to                            examine a mass. This was the intended extent. The                            colonoscopy was performed without difficulty. The                            patient tolerated the procedure. The quality of the                            bowel preparation was adequate. Scope In: 2:36:12 PM Scope Out: 2:50:15 PM Total Procedure Duration: 0 hours 14 minutes 3 seconds  Findings:                 The digital rectal exam was normal. Pertinent                            negatives include no palpable rectal lesions.                           A frond-like/villous, fungating, infiltrative and                            ulcerated completely obstructing large mass was                            found in the mid ascending colon. The mass was                            circumferential. Oozing was present. Biopsies were                            taken with a cold forceps for histology. Area was                            tattooed with an injection of Spot (carbon black)  placed distally.                           Multiple small-mouthed diverticula were found in                            the recto-sigmoid colon and sigmoid colon.                           Normal mucosa was found in the rest of the                            visualized colon.                           Non-bleeding non-thrombosed internal hemorrhoids                            were found during retroflexion, during perianal                            exam and during digital exam. The hemorrhoids were                            Grade I (internal hemorrhoids that do not prolapse). Complications:            No immediate complications. Estimated Blood Loss:     Estimated blood loss was minimal. Impression:               - Rule out malignancy, completely obstructing tumor                            in the mid ascending colon. Biopsied. Tattooed                             distally.                           - Diverticulosis in the recto-sigmoid colon and in                            the sigmoid colon.                           - Normal mucosa in the rest of the visualized colon.                           - Non-bleeding non-thrombosed internal hemorrhoids. Recommendation:           - The patient will be observed post-procedure,                            until all discharge criteria are met.                           - Discharge patient to home.                           -  Patient has a contact number available for                            emergencies. The signs and symptoms of potential                            delayed complications were discussed with the                            patient. Return to normal activities tomorrow.                            Written discharge instructions were provided to the                            patient.                           - Clear liquid diet.                           - Continue present medications.                           - Await pathology results.                           - Repeat colonoscopy in 1 year for surveillance.                           - Follow up with surgery as planned for upcoming                            surgical intervention.                           - I am away the next few days (we have Rushed the                            Pathology) so we will make sure this gets to the                            Surgeons. My team will reach out to let patient                            know the results as well (pending they are back                            before Friday).                           - The findings and recommendations were discussed                            with the patient.                           -  The findings and recommendations were discussed                            with the patient's family. Aloha Finner, MD 01/16/2024 2:57:04 PM

## 2024-01-16 NOTE — Progress Notes (Unsigned)
 1440 Patient experiencing nausea and retching.  MD updated and Zofran  4 mg IV given, vss

## 2024-01-16 NOTE — Progress Notes (Signed)
 Vitals-EP  Interpreter used today at the [facility] for this pt.  Interpreter's name is- Eric.  Pt's states no medical or surgical changes since previsit or office visit.

## 2024-01-16 NOTE — Progress Notes (Unsigned)
 Report given to PACU, vss

## 2024-01-16 NOTE — Progress Notes (Addendum)
 Anesthesia Review:  PCP: Cardiologist :  PPM/ ICD: Device Orders: Rep Notified:  Chest x-ray : EKG : Echo : Stress test: Cardiac Cath :   Activity level:  Sleep Study/ CPAP : Fasting Blood Sugar :      / Checks Blood Sugar -- times a day:    Blood Thinner/ Instructions /Last Dose: ASA / Instructions/ Last Dose :    Spanish  Bowel Prep  Colonoscopy 01/16/24    PT came by on 01/16/24 prior to colonoscopy and picked up preop instructons for surgeryon 9/26, and hibiclens  and ensure presurgery drinks.   Called [facility] Triage on 01/16/24 and LVMM and sked if pt needed to do another bowel preop on 01/17/24 due to bowel prep on 01/15/24 for colonoscopy.  Asked for call back. Called [facility] back and Triage Nurse stated they had spoken  with pt this am and that since having colonoscopy on 01/16/24 pt will continue on a liquid diet after colonoscopy and  until surgery.  And do the antibiotic part of the bowel prep on 01/16/25.     Called pt at home on 01/16/24 after pt had completed colonoscopy.  Pt states he feels fine after colonoscopy except for feeling sleepy.  Pt speaks and understands English well.  Pt stated he will continue liquids after colonoscopy until surgery on 01/17/25 and is aware to take the antibiotic part of the bowel prep on 01/17/24.  Informed pt that I would go over with him the hibiclens  soap for surgery and the Ensure presurgery drinks in the bag he picked up on 01/16/24 and the instructions.  PT to come by on 9/25 at 1430pm and check in Admitting and pt knows where to come to and we will obtain labs and pt will sign consent and preop nurse will review instructons for day of surgery.

## 2024-01-16 NOTE — Progress Notes (Signed)
 Called to room to assist during endoscopic procedure.  Patient ID and intended procedure confirmed with present staff. Received instructions for my participation in the procedure from the performing physician.

## 2024-01-16 NOTE — Progress Notes (Unsigned)
 GASTROENTEROLOGY PROCEDURE H&P NOTE   Primary Care Physician: Pcp, No  HPI: William Blankenship is a 48 y.o. male who presents for Colonoscopy for abnormal imaging concern for cancer.  Past Medical History:  Diagnosis Date   Abrasion of anterior lower leg 07/29/2012   Back pain    Colonic mass    GERD (gastroesophageal reflux disease)    Hand injury 03/31/2013   Hyperlipidemia    Migraines    PATELLAR DISLOCATION, LEFT 01/31/2010   Qualifier: Diagnosis of  By: Rosalynn MD, Camie     History reviewed. No pertinent surgical history. Current Outpatient Medications  Medication Sig Dispense Refill   acetaminophen  (TYLENOL ) 325 MG tablet Take 650 mg by mouth every 6 (six) hours as needed for moderate pain (pain score 4-6).     azelastine  (ASTELIN ) 0.1 % nasal spray Place 1 spray into both nostrils 2 (two) times daily. Use in each nostril as directed (Patient not taking: Reported on 01/04/2024) 30 mL 1   Calcium Carbonate (CALCIUM 500 PO) Take 1 tablet by mouth daily.     CALCIUM MAGNESIUM ZINC PO Take 1 tablet by mouth daily.     Cetirizine  HCl 10 MG CAPS Take 1 capsule (10 mg total) by mouth daily for 10 days. (Patient not taking: No sig reported) 10 capsule 0   fluticasone  (FLONASE ) 50 MCG/ACT nasal spray Place 1-2 sprays into both nostrils daily for 7 days. (Patient not taking: No sig reported) 1 g 0   ibuprofen  (ADVIL ) 600 MG tablet Take 1 tablet (600 mg total) by mouth every 6 (six) hours as needed. (Patient not taking: No sig reported) 30 tablet 0   Iron  Combinations (IRON  COMPLEX PO) Take 1 tablet by mouth daily.     Misc Natural Products (APPLE CIDER VINEGAR DIET PO) Take 1 capsule by mouth daily.     Nutritional Supplements (ENSURE ORIGINAL) LIQD Take 1 Bottle by mouth daily.     OVER THE COUNTER MEDICATION Take 1 Scoop by mouth daily. Elviation whey protein blend     oxyCODONE  (ROXICODONE ) 5 MG immediate release tablet Take 1 tablet (5 mg total) by mouth every 4 (four) hours as  needed for severe pain (pain score 7-10). 60 tablet 0   Current Facility-Administered Medications  Medication Dose Route Frequency Provider Last Rate Last Admin   0.9 %  sodium chloride  infusion  500 mL Intravenous Once Mansouraty, Harriet Bollen Jr., MD        Current Outpatient Medications:    acetaminophen  (TYLENOL ) 325 MG tablet, Take 650 mg by mouth every 6 (six) hours as needed for moderate pain (pain score 4-6)., Disp: , Rfl:    azelastine  (ASTELIN ) 0.1 % nasal spray, Place 1 spray into both nostrils 2 (two) times daily. Use in each nostril as directed (Patient not taking: Reported on 01/04/2024), Disp: 30 mL, Rfl: 1   Calcium Carbonate (CALCIUM 500 PO), Take 1 tablet by mouth daily., Disp: , Rfl:    CALCIUM MAGNESIUM ZINC PO, Take 1 tablet by mouth daily., Disp: , Rfl:    Cetirizine  HCl 10 MG CAPS, Take 1 capsule (10 mg total) by mouth daily for 10 days. (Patient not taking: No sig reported), Disp: 10 capsule, Rfl: 0   fluticasone  (FLONASE ) 50 MCG/ACT nasal spray, Place 1-2 sprays into both nostrils daily for 7 days. (Patient not taking: No sig reported), Disp: 1 g, Rfl: 0   ibuprofen  (ADVIL ) 600 MG tablet, Take 1 tablet (600 mg total) by mouth every 6 (six) hours as  needed. (Patient not taking: No sig reported), Disp: 30 tablet, Rfl: 0   Iron  Combinations (IRON  COMPLEX PO), Take 1 tablet by mouth daily., Disp: , Rfl:    Misc Natural Products (APPLE CIDER VINEGAR DIET PO), Take 1 capsule by mouth daily., Disp: , Rfl:    Nutritional Supplements (ENSURE ORIGINAL) LIQD, Take 1 Bottle by mouth daily., Disp: , Rfl:    OVER THE COUNTER MEDICATION, Take 1 Scoop by mouth daily. Elviation whey protein blend, Disp: , Rfl:    oxyCODONE  (ROXICODONE ) 5 MG immediate release tablet, Take 1 tablet (5 mg total) by mouth every 4 (four) hours as needed for severe pain (pain score 7-10)., Disp: 60 tablet, Rfl: 0  Current Facility-Administered Medications:    0.9 %  sodium chloride  infusion, 500 mL, Intravenous,  Once, Mansouraty, Aloha Raddle., MD No Known Allergies Family History  Problem Relation Age of Onset   Colon cancer Neg Hx    Colon polyps Neg Hx    Esophageal cancer Neg Hx    Rectal cancer Neg Hx    Stomach cancer Neg Hx    Social History   Socioeconomic History   Marital status: Married    Spouse name: Not on file   Number of children: 3   Years of education: Not on file   Highest education level: Not on file  Occupational History   Not on file  Tobacco Use   Smoking status: Former    Types: Cigarettes    Passive exposure: Yes   Smokeless tobacco: Never  Vaping Use   Vaping status: Never Used  Substance and Sexual Activity   Alcohol use: Yes    Comment: once every a few weeks   Drug use: No   Sexual activity: Yes  Other Topics Concern   Not on file  Social History Narrative   Not on file   Social Drivers of Health   Financial Resource Strain: Not on file  Food Insecurity: No Food Insecurity (12/27/2023)   Hunger Vital Sign    Worried About Running Out of Food in the Last Year: Never true    Ran Out of Food in the Last Year: Never true  Transportation Needs: No Transportation Needs (12/27/2023)   PRAPARE - Administrator, Civil Service (Medical): No    Lack of Transportation (Non-Medical): No  Physical Activity: Not on file  Stress: Not on file  Social Connections: Not on file  Intimate Partner Violence: Not At Risk (12/27/2023)   Humiliation, Afraid, Rape, and Kick questionnaire    Fear of Current or Ex-Partner: No    Emotionally Abused: No    Physically Abused: No    Sexually Abused: No    Physical Exam: Today's Vitals   01/16/24 1314  BP: 115/76  Pulse: 97  Temp: 98.6 F (37 C)  TempSrc: Temporal  SpO2: 99%  Weight: 138 lb (62.6 kg)  Height: 5' 5 (1.651 m)  PainSc: 0-No pain   Body mass index is 22.96 kg/m. GEN: NAD EYE: Sclerae anicteric ENT: MMM CV: Non-tachycardic GI: Soft, NT/ND NEURO:  Alert & Oriented x 3  Lab  Results: No results for input(s): WBC, HGB, HCT, PLT in the last 72 hours. BMET No results for input(s): NA, K, CL, CO2, GLUCOSE, BUN, CREATININE, CALCIUM in the last 72 hours. LFT No results for input(s): PROT, ALBUMIN , AST, ALT, ALKPHOS, BILITOT, BILIDIR, IBILI in the last 72 hours. PT/INR No results for input(s): LABPROT, INR in the last 72 hours.  Impression / Plan: This is a 48 y.o.male who presents for Colonoscopy for abnormal imaging concern for cancer.  The risks and benefits of endoscopic evaluation/treatment were discussed with the patient and/or family; these include but are not limited to the risk of perforation, infection, bleeding, missed lesions, lack of diagnosis, severe illness requiring hospitalization, as well as anesthesia and sedation related illnesses.  The patient's history has been reviewed, patient examined, no change in status, and deemed stable for procedure.  The patient and/or family is agreeable to proceed.    Aloha Finner, MD [facility] Advanced Endoscopy Office # [phone]

## 2024-01-16 NOTE — Patient Instructions (Addendum)
 SURGICAL WAITING ROOM VISITATION  Patients having surgery or a procedure may have no more than 2 support people in the waiting area - these visitors may rotate.    Children under the age of 9 must have an adult with them who is not the patient.  Visitors with respiratory illnesses are discouraged from visiting and should remain at home.  If the patient needs to stay at the hospital during part of their recovery, the visitor guidelines for inpatient rooms apply. Pre-op nurse will coordinate an appropriate time for 1 support person to accompany patient in pre-op.  This support person may not rotate.    Please refer to the [facility] website for the visitor guidelines for Inpatients (after your surgery is over and you are in a regular room).       Your procedure is scheduled on:  01/18/24    Report to [facility] Main Entrance    Report to admitting at  0715 AM   Call this number if you have problems the morning of surgery [phone]   Clear liquid diet on day of bowel prep.            Follow bowel prep instrucgtions per MD    After Midnight you may have the following liquids until _ 0630_____ AM  DAY OF SURGERY  Water Non-Citrus Juices (without pulp, NO RED-Apple, White grape, White cranberry) Black Coffee (NO MILK/CREAM OR CREAMERS, sugar ok)  Clear Tea (NO MILK/CREAM OR CREAMERS, sugar ok) regular and decaf                             Plain Jell-O (NO RED)                                           Fruit ices (not with fruit pulp, NO RED)                                     Popsicles (NO RED)                                                               Sports drinks like Gatorade (NO RED)              Drink 2 Ensure/G2 drinks AT 10:00 PM the night before surgery.        The day of surgery:  Drink ONE (1) Pre-Surgery Clear Ensure or G2 at 0630 AM the morning of surgery. Drink in one sitting. Do not sip.  This drink was given to you during your hospital  pre-op  appointment visit. Nothing else to drink after completing the  Pre-Surgery Clear Ensure or G2.          If you have questions, please contact your surgeon's office.   FOLLOW BOWEL PREP AND ANY ADDITIONAL PRE OP INSTRUCTIONS YOU RECEIVED FROM YOUR SURGEON'S OFFICE!!!     Oral Hygiene is also important to reduce your risk of infection.  Remember - BRUSH YOUR TEETH THE MORNING OF SURGERY WITH YOUR REGULAR TOOTHPASTE  DENTURES WILL BE REMOVED PRIOR TO SURGERY PLEASE DO NOT APPLY Poly grip OR ADHESIVES!!!   Do NOT smoke after Midnight   Stop all vitamins and herbal supplements 7 days before surgery.   Take these medicines the morning of surgery with A SIP OF WATER:  none   DO NOT TAKE ANY ORAL DIABETIC MEDICATIONS DAY OF YOUR SURGERY  Bring CPAP mask and tubing day of surgery.                              You may not have any metal on your body including hair pins, jewelry, and body piercing             Do not wear make-up, lotions, powders, perfumes/cologne, or deodorant  Do not wear nail polish including gel and S&S, artificial/acrylic nails, or any other type of covering on natural nails including finger and toenails. If you have artificial nails, gel coating, etc. that needs to be removed by a nail salon please have this removed prior to surgery or surgery may need to be canceled/ delayed if the surgeon/ anesthesia feels like they are unable to be safely monitored.   Do not shave  48 hours prior to surgery.               Men may shave face and neck.   Do not bring valuables to the hospital. [facility] IS NOT             RESPONSIBLE   FOR VALUABLES.   Contacts, glasses, dentures or bridgework may not be worn into surgery.   Bring small overnight bag day of surgery.   DO NOT BRING YOUR HOME MEDICATIONS TO THE HOSPITAL. PHARMACY WILL DISPENSE MEDICATIONS LISTED ON YOUR MEDICATION LIST TO YOU DURING YOUR ADMISSION IN THE  HOSPITAL!    Patients discharged on the day of surgery will not be allowed to drive home.  Someone NEEDS to stay with you for the first 24 hours after anesthesia.   Special Instructions: Bring a copy of your healthcare power of attorney and living will documents the day of surgery if you haven't scanned them before.              Please read over the following fact sheets you were given: IF YOU HAVE QUESTIONS ABOUT YOUR PRE-OP INSTRUCTIONS PLEASE CALL [phone].   If you received a COVID test during your pre-op visit  it is requested that you wear a mask when out in public, stay away from anyone that may not be feeling well and notify your surgeon if you develop symptoms. If you test positive for Covid or have been in contact with anyone that has tested positive in the last 10 days please notify you surgeon.    [facility] - Preparing for Surgery Before surgery, you can play an important role.  Because skin is not sterile, your skin needs to be as free of germs as possible.  You can reduce the number of germs on your skin by washing with CHG (chlorahexidine gluconate) soap before surgery.  CHG is an antiseptic cleaner which kills germs and bonds with the skin to continue killing germs even after washing. Please DO NOT use if you have an allergy to CHG or antibacterial soaps.  If your skin becomes reddened/irritated stop using the CHG and inform your nurse when you  arrive at Short Stay. Do not shave (including legs and underarms) for at least 48 hours prior to the first CHG shower.  You may shave your face/neck. Please follow these instructions carefully:  1.  Shower with CHG Soap the night before surgery and the  morning of Surgery.  2.  If you choose to wash your hair, wash your hair first as usual with your  normal  shampoo.  3.  After you shampoo, rinse your hair and body thoroughly to remove the  shampoo.                           4.  Use CHG as you would any other liquid soap.  You can apply chg  directly  to the skin and wash                       Gently with a scrungie or clean washcloth.  5.  Apply the CHG Soap to your body ONLY FROM THE NECK DOWN.   Do not use on face/ open                           Wound or open sores. Avoid contact with eyes, ears mouth and genitals (private parts).                       Wash face,  Genitals (private parts) with your normal soap.             6.  Wash thoroughly, paying special attention to the area where your surgery  will be performed.  7.  Thoroughly rinse your body with warm water from the neck down.  8.  DO NOT shower/wash with your normal soap after using and rinsing off  the CHG Soap.                9.  Pat yourself dry with a clean towel.            10.  Wear clean pajamas.            11.  Place clean sheets on your bed the night of your first shower and do not  sleep with pets. Day of Surgery : Do not apply any lotions/deodorants the morning of surgery.  Please wear clean clothes to the hospital/surgery center.  FAILURE TO FOLLOW THESE INSTRUCTIONS MAY RESULT IN THE CANCELLATION OF YOUR SURGERY PATIENT SIGNATURE_________________________________  NURSE SIGNATURE__________________________________  ________________________________________________________________________

## 2024-01-17 ENCOUNTER — Other Ambulatory Visit: Payer: Self-pay

## 2024-01-17 ENCOUNTER — Encounter (HOSPITAL_COMMUNITY)
Admission: RE | Admit: 2024-01-17 | Discharge: 2024-01-17 | Disposition: A | Discharge status: Home / Self Care | Payer: Self-pay | Source: Ambulatory Visit | Attending: Surgery | Admitting: Surgery

## 2024-01-17 ENCOUNTER — Encounter (HOSPITAL_COMMUNITY): Payer: Self-pay

## 2024-01-17 ENCOUNTER — Telehealth: Payer: Self-pay | Admitting: *Deleted

## 2024-01-17 ENCOUNTER — Telehealth: Payer: Self-pay

## 2024-01-17 VITALS — BP 106/73 | HR 73 | Temp 98.1°F | Resp 16 | Ht 65.0 in | Wt 134.0 lb

## 2024-01-17 DIAGNOSIS — Z01818 Encounter for other preprocedural examination: Secondary | ICD-10-CM

## 2024-01-17 DIAGNOSIS — Z01812 Encounter for preprocedural laboratory examination: Secondary | ICD-10-CM | POA: Insufficient documentation

## 2024-01-17 HISTORY — DX: Anemia, unspecified: D64.9

## 2024-01-17 LAB — COMPREHENSIVE METABOLIC PANEL WITH GFR
ALT: 17 U/L (ref 0–44)
AST: 18 U/L (ref 15–41)
Albumin: 3.1 g/dL — ABNORMAL LOW (ref 3.5–5.0)
Alkaline Phosphatase: 65 U/L (ref 38–126)
Anion gap: 9 (ref 5–15)
BUN: 9 mg/dL (ref 6–20)
CO2: 23 mmol/L (ref 22–32)
Calcium: 8.5 mg/dL — ABNORMAL LOW (ref 8.9–10.3)
Chloride: 105 mmol/L (ref 98–111)
Creatinine, Ser: 1.21 mg/dL (ref 0.61–1.24)
GFR, Estimated: >60 mL/min (ref >60)
Glucose, Bld: 90 mg/dL (ref 70–99)
Potassium: 3.8 mmol/L (ref 3.5–5.1)
Sodium: 137 mmol/L (ref 135–145)
Total Bilirubin: 0.4 mg/dL (ref 0.0–1.2)
Total Protein: 6.2 g/dL — ABNORMAL LOW (ref 6.5–8.1)

## 2024-01-17 LAB — TYPE AND SCREEN
ABO/RH(D): O POS
Antibody Screen: NEGATIVE

## 2024-01-17 LAB — CBC WITH DIFFERENTIAL/PLATELET
Abs Immature Granulocytes: 0.06 K/uL (ref 0.00–0.07)
Basophils Absolute: 0 K/uL (ref 0.0–0.1)
Basophils Relative: 0 %
Eosinophils Absolute: 0.1 K/uL (ref 0.0–0.5)
Eosinophils Relative: 1 %
HCT: 34.1 % — ABNORMAL LOW (ref 39.0–52.0)
Hemoglobin: 9.9 g/dL — ABNORMAL LOW (ref 13.0–17.0)
Immature Granulocytes: 0 %
Lymphocytes Relative: 19 %
Lymphs Abs: 2.9 K/uL (ref 0.7–4.0)
MCH: 20.5 pg — ABNORMAL LOW (ref 26.0–34.0)
MCHC: 29 g/dL — ABNORMAL LOW (ref 30.0–36.0)
MCV: 70.6 fL — ABNORMAL LOW (ref 80.0–100.0)
Monocytes Absolute: 2 K/uL — ABNORMAL HIGH (ref 0.1–1.0)
Monocytes Relative: 13 %
Neutro Abs: 9.9 K/uL — ABNORMAL HIGH (ref 1.7–7.7)
Neutrophils Relative %: 67 %
Platelets: 542 K/uL — ABNORMAL HIGH (ref 150–400)
RBC: 4.83 MIL/uL (ref 4.22–5.81)
RDW: 18.6 % — ABNORMAL HIGH (ref 11.5–15.5)
WBC: 15 K/uL — ABNORMAL HIGH (ref 4.0–10.5)
nRBC: 0 % (ref 0.0–0.2)

## 2024-01-17 LAB — SURGICAL PATHOLOGY

## 2024-01-17 NOTE — Telephone Encounter (Signed)
  Follow up Call-     01/16/2024    1:22 PM 01/16/2024    1:13 PM  Call back number  Post procedure Call Back phone  # [phone]   Permission to leave phone message  Yes     Patient questions:  Do you have a fever, pain , or abdominal swelling? No. Pain Score  0 *  Have you tolerated food without any problems? Yes.    Have you been able to return to your normal activities? Yes.    Do you have any questions about your discharge instructions: Diet   No. Medications  No. Follow up visit  No.  Do you have questions or concerns about your Care? No.  Actions: * If pain score is 4 or above: No action needed, pain <4.

## 2024-01-17 NOTE — Telephone Encounter (Signed)
 Please see note from pathology  The pt has been advised using interpreter service   He states he has an appt today with the surgeon and will keep that appt as planned

## 2024-01-17 NOTE — Telephone Encounter (Signed)
-----   Message from [facility] sent at 01/16/2024  2:57 PM EDT ----- Regarding: [facility] patient CW and YF, Please see the Colonoscopy report from this afternoon. Extensively biopsied. He is on for you this week CW for procedure. I have rushed the pathology. I am away the next few days, so I am putting my RN on here so that she may forward the results to you all if they return before Friday.  [place], When these results return, please forward to Dr. Teresa and Dr. Lanny. Please call the patient to let him know the results (he is expecting a call and knows that we are very concerned about malignancy). If there are any other questions on pathology, please include the MD of the Day. Thanks. GM

## 2024-01-18 ENCOUNTER — Inpatient Hospital Stay (HOSPITAL_COMMUNITY): Payer: MEDICAID | Admitting: Physician Assistant

## 2024-01-18 ENCOUNTER — Inpatient Hospital Stay (HOSPITAL_COMMUNITY)
Admission: RE | Admit: 2024-01-18 | Discharge: 2024-01-21 | DRG: 331 | Disposition: A | Discharge status: Home / Self Care | Payer: MEDICAID | Attending: Surgery | Admitting: Surgery

## 2024-01-18 ENCOUNTER — Encounter (HOSPITAL_COMMUNITY): Payer: Self-pay | Admitting: Surgery

## 2024-01-18 ENCOUNTER — Inpatient Hospital Stay (HOSPITAL_COMMUNITY): Payer: MEDICAID | Admitting: Anesthesiology

## 2024-01-18 ENCOUNTER — Other Ambulatory Visit: Payer: Self-pay

## 2024-01-18 ENCOUNTER — Encounter (HOSPITAL_COMMUNITY)
Admission: RE | Disposition: A | Discharge status: Home / Self Care | Payer: Self-pay | Source: Home / Self Care | Attending: Surgery

## 2024-01-18 DIAGNOSIS — K648 Other hemorrhoids: Secondary | ICD-10-CM | POA: Diagnosis present

## 2024-01-18 DIAGNOSIS — R591 Generalized enlarged lymph nodes: Secondary | ICD-10-CM | POA: Diagnosis present

## 2024-01-18 DIAGNOSIS — G43909 Migraine, unspecified, not intractable, without status migrainosus: Secondary | ICD-10-CM | POA: Diagnosis present

## 2024-01-18 DIAGNOSIS — C182 Malignant neoplasm of ascending colon: Principal | ICD-10-CM | POA: Diagnosis present

## 2024-01-18 DIAGNOSIS — K219 Gastro-esophageal reflux disease without esophagitis: Secondary | ICD-10-CM | POA: Diagnosis present

## 2024-01-18 DIAGNOSIS — K5730 Diverticulosis of large intestine without perforation or abscess without bleeding: Secondary | ICD-10-CM | POA: Diagnosis present

## 2024-01-18 DIAGNOSIS — Z87891 Personal history of nicotine dependence: Secondary | ICD-10-CM

## 2024-01-18 DIAGNOSIS — Z01818 Encounter for other preprocedural examination: Secondary | ICD-10-CM

## 2024-01-18 DIAGNOSIS — E785 Hyperlipidemia, unspecified: Secondary | ICD-10-CM | POA: Diagnosis present

## 2024-01-18 DIAGNOSIS — Z9049 Acquired absence of other specified parts of digestive tract: Principal | ICD-10-CM

## 2024-01-18 SURGERY — EXCISION, CECUM WITH ILEUM, LAPAROSCOPIC
Anesthesia: General | Laterality: Right

## 2024-01-18 MED ORDER — FENTANYL CITRATE PF 50 MCG/ML IJ SOSY
25.0000 ug | PREFILLED_SYRINGE | INTRAMUSCULAR | Status: DC | PRN
  Administered 2024-01-18 (×2): 50 ug via INTRAVENOUS

## 2024-01-18 MED ORDER — SUGAMMADEX SODIUM 200 MG/2ML IV SOLN
INTRAVENOUS | Status: AC
  Filled 2024-01-18: qty 2

## 2024-01-18 MED ORDER — BISACODYL 5 MG PO TBEC: 20.0000 mg | DELAYED_RELEASE_TABLET | Freq: Once | ORAL | Status: DC

## 2024-01-18 MED ORDER — FENTANYL CITRATE (PF) 100 MCG/2ML IJ SOLN
INTRAMUSCULAR | Status: DC | PRN
  Administered 2024-01-18 (×4): 50 ug via INTRAVENOUS

## 2024-01-18 MED ORDER — POLYSACCHARIDE IRON COMPLEX 150 MG PO CAPS
150.0000 mg | ORAL_CAPSULE | Freq: Every day | ORAL | Status: DC
  Administered 2024-01-19 – 2024-01-21 (×3): 150 mg via ORAL
  Filled 2024-01-18 (×3): qty 1

## 2024-01-18 MED ORDER — LACTATED RINGERS IV SOLN: INTRAVENOUS | Status: DC

## 2024-01-18 MED ORDER — BUPIVACAINE-EPINEPHRINE (PF) 0.25% -1:200000 IJ SOLN
INTRAMUSCULAR | Status: AC
  Filled 2024-01-18: qty 30

## 2024-01-18 MED ORDER — ONDANSETRON HCL 4 MG/2ML IJ SOLN
INTRAMUSCULAR | Status: DC | PRN
  Administered 2024-01-18: 4 mg via INTRAVENOUS

## 2024-01-18 MED ORDER — ONDANSETRON HCL 4 MG/2ML IJ SOLN
INTRAMUSCULAR | Status: AC
  Filled 2024-01-18: qty 2

## 2024-01-18 MED ORDER — KETAMINE HCL 10 MG/ML IJ SOLN
INTRAMUSCULAR | Status: DC | PRN
  Administered 2024-01-18: 30 mg via INTRAVENOUS

## 2024-01-18 MED ORDER — FENTANYL CITRATE (PF) 250 MCG/5ML IJ SOLN
INTRAMUSCULAR | Status: AC
  Filled 2024-01-18: qty 5

## 2024-01-18 MED ORDER — METRONIDAZOLE 500 MG PO TABS: 1000.0000 mg | ORAL_TABLET | ORAL | Status: DC

## 2024-01-18 MED ORDER — IBUPROFEN 400 MG PO TABS
600.0000 mg | ORAL_TABLET | Freq: Four times a day (QID) | ORAL | Status: DC | PRN
  Administered 2024-01-19: 600 mg via ORAL
  Filled 2024-01-18: qty 1

## 2024-01-18 MED ORDER — HEPARIN SODIUM (PORCINE) 5000 UNIT/ML IJ SOLN
5000.0000 [IU] | Freq: Once | INTRAMUSCULAR | Status: AC
  Administered 2024-01-18: 5000 [IU] via SUBCUTANEOUS
  Filled 2024-01-18: qty 1

## 2024-01-18 MED ORDER — ALBUMIN HUMAN 5 % IV SOLN: INTRAVENOUS | Status: DC | PRN

## 2024-01-18 MED ORDER — PROPOFOL 10 MG/ML IV BOLUS
INTRAVENOUS | Status: DC | PRN
  Administered 2024-01-18: 200 mg via INTRAVENOUS

## 2024-01-18 MED ORDER — DEXAMETHASONE SODIUM PHOSPHATE 10 MG/ML IJ SOLN
INTRAMUSCULAR | Status: AC
  Filled 2024-01-18: qty 1

## 2024-01-18 MED ORDER — HYDROMORPHONE HCL 1 MG/ML IJ SOLN
0.5000 mg | INTRAMUSCULAR | Status: DC | PRN
  Administered 2024-01-18 – 2024-01-19 (×2): 0.5 mg via INTRAVENOUS
  Filled 2024-01-18 (×2): qty 0.5

## 2024-01-18 MED ORDER — OXYCODONE HCL 5 MG PO TABS: 5.0000 mg | ORAL_TABLET | Freq: Four times a day (QID) | ORAL | 0 refills | Status: AC | PRN

## 2024-01-18 MED ORDER — HYDRALAZINE HCL 20 MG/ML IJ SOLN: 10.0000 mg | INTRAMUSCULAR | Status: DC | PRN

## 2024-01-18 MED ORDER — ONDANSETRON HCL 4 MG/2ML IJ SOLN: 4.0000 mg | Freq: Four times a day (QID) | INTRAMUSCULAR | Status: DC | PRN

## 2024-01-18 MED ORDER — ALVIMOPAN 12 MG PO CAPS
12.0000 mg | ORAL_CAPSULE | Freq: Two times a day (BID) | ORAL | Status: DC
  Administered 2024-01-19 (×2): 12 mg via ORAL
  Filled 2024-01-18 (×2): qty 1

## 2024-01-18 MED ORDER — CHLORHEXIDINE GLUCONATE CLOTH 2 % EX PADS: 6.0000 | MEDICATED_PAD | Freq: Once | CUTANEOUS | Status: DC

## 2024-01-18 MED ORDER — ACETAMINOPHEN 500 MG PO TABS
1000.0000 mg | ORAL_TABLET | ORAL | Status: AC
  Administered 2024-01-18: 1000 mg via ORAL
  Filled 2024-01-18: qty 2

## 2024-01-18 MED ORDER — DIPHENHYDRAMINE HCL 50 MG/ML IJ SOLN: 12.5000 mg | Freq: Four times a day (QID) | INTRAMUSCULAR | Status: DC | PRN

## 2024-01-18 MED ORDER — ALBUMIN HUMAN 5 % IV SOLN
INTRAVENOUS | Status: AC
  Filled 2024-01-18: qty 250

## 2024-01-18 MED ORDER — CHLORHEXIDINE GLUCONATE 0.12 % MT SOLN
15.0000 mL | Freq: Once | OROMUCOSAL | Status: AC
  Administered 2024-01-18: 15 mL via OROMUCOSAL

## 2024-01-18 MED ORDER — SODIUM CHLORIDE 0.9 % IV SOLN
2.0000 g | INTRAVENOUS | Status: AC
  Administered 2024-01-18: 2 g via INTRAVENOUS
  Filled 2024-01-18: qty 2

## 2024-01-18 MED ORDER — ORAL CARE MOUTH RINSE: 15.0000 mL | Freq: Once | OROMUCOSAL | Status: AC

## 2024-01-18 MED ORDER — ENSURE PRE-SURGERY PO LIQD
296.0000 mL | Freq: Once | ORAL | Status: DC
  Filled 2024-01-18: qty 296

## 2024-01-18 MED ORDER — ALUM & MAG HYDROXIDE-SIMETH 200-200-20 MG/5ML PO SUSP
30.0000 mL | Freq: Four times a day (QID) | ORAL | Status: DC | PRN
  Administered 2024-01-18: 30 mL via ORAL
  Filled 2024-01-18: qty 30

## 2024-01-18 MED ORDER — BUPIVACAINE-EPINEPHRINE (PF) 0.5% -1:200000 IJ SOLN
INTRAMUSCULAR | Status: AC
  Filled 2024-01-18: qty 30

## 2024-01-18 MED ORDER — DEXAMETHASONE SODIUM PHOSPHATE 10 MG/ML IJ SOLN
INTRAMUSCULAR | Status: DC | PRN
  Administered 2024-01-18: 8 mg via INTRAVENOUS

## 2024-01-18 MED ORDER — ALVIMOPAN 12 MG PO CAPS
12.0000 mg | ORAL_CAPSULE | ORAL | Status: AC
Start: 2024-01-18 — End: 2024-01-18
  Administered 2024-01-18: 12 mg via ORAL
  Filled 2024-01-18: qty 1

## 2024-01-18 MED ORDER — SUGAMMADEX SODIUM 200 MG/2ML IV SOLN
INTRAVENOUS | Status: DC | PRN
  Administered 2024-01-18: 200 mg via INTRAVENOUS

## 2024-01-18 MED ORDER — ROCURONIUM BROMIDE 10 MG/ML (PF) SYRINGE
PREFILLED_SYRINGE | INTRAVENOUS | Status: AC
  Filled 2024-01-18: qty 10

## 2024-01-18 MED ORDER — ONDANSETRON HCL 4 MG PO TABS
4.0000 mg | ORAL_TABLET | Freq: Four times a day (QID) | ORAL | Status: DC | PRN
  Administered 2024-01-20: 4 mg via ORAL
  Filled 2024-01-18: qty 1

## 2024-01-18 MED ORDER — HEPARIN SODIUM (PORCINE) 5000 UNIT/ML IJ SOLN
5000.0000 [IU] | Freq: Three times a day (TID) | INTRAMUSCULAR | Status: DC
  Administered 2024-01-18 – 2024-01-19 (×2): 5000 [IU] via SUBCUTANEOUS
  Filled 2024-01-18 (×2): qty 1

## 2024-01-18 MED ORDER — IRON COMPLEX PO CAPS
ORAL_CAPSULE | Freq: Every day | ORAL | Status: DC
Start: 2024-01-18 — End: 2024-01-18

## 2024-01-18 MED ORDER — OXYCODONE HCL 5 MG/5ML PO SOLN
5.0000 mg | Freq: Once | ORAL | Status: AC | PRN
  Administered 2024-01-18: 5 mg via ORAL

## 2024-01-18 MED ORDER — SIMETHICONE 80 MG PO CHEW: 40.0000 mg | CHEWABLE_TABLET | Freq: Four times a day (QID) | ORAL | Status: DC | PRN

## 2024-01-18 MED ORDER — LIDOCAINE HCL (PF) 2 % IJ SOLN
INTRAMUSCULAR | Status: AC
  Filled 2024-01-18: qty 5

## 2024-01-18 MED ORDER — MIDAZOLAM HCL 2 MG/2ML IJ SOLN
INTRAMUSCULAR | Status: AC
  Filled 2024-01-18: qty 2

## 2024-01-18 MED ORDER — KETAMINE HCL 50 MG/5ML IJ SOSY
PREFILLED_SYRINGE | INTRAMUSCULAR | Status: AC
Start: 2024-01-18 — End: 2024-01-18
  Filled 2024-01-18: qty 5

## 2024-01-18 MED ORDER — SUCCINYLCHOLINE CHLORIDE 200 MG/10ML IV SOSY
PREFILLED_SYRINGE | INTRAVENOUS | Status: DC | PRN
  Administered 2024-01-18: 120 mg via INTRAVENOUS

## 2024-01-18 MED ORDER — POLYETHYLENE GLYCOL 3350 17 GM/SCOOP PO POWD: 238.0000 g | Freq: Once | ORAL | Status: DC

## 2024-01-18 MED ORDER — BUPIVACAINE-EPINEPHRINE 0.25% -1:200000 IJ SOLN
INTRAMUSCULAR | Status: DC | PRN
  Administered 2024-01-18: 30 mL

## 2024-01-18 MED ORDER — FENTANYL CITRATE PF 50 MCG/ML IJ SOSY
PREFILLED_SYRINGE | INTRAMUSCULAR | Status: AC
  Filled 2024-01-18: qty 1

## 2024-01-18 MED ORDER — SUCCINYLCHOLINE CHLORIDE 200 MG/10ML IV SOSY
PREFILLED_SYRINGE | INTRAVENOUS | Status: AC
  Filled 2024-01-18: qty 10

## 2024-01-18 MED ORDER — HYDROMORPHONE HCL 1 MG/ML IJ SOLN
INTRAMUSCULAR | Status: DC | PRN
  Administered 2024-01-18: .5 mg via INTRAVENOUS

## 2024-01-18 MED ORDER — LIDOCAINE HCL (CARDIAC) PF 100 MG/5ML IV SOSY
PREFILLED_SYRINGE | INTRAVENOUS | Status: DC | PRN
  Administered 2024-01-18: 60 mg via INTRAVENOUS

## 2024-01-18 MED ORDER — HYDROMORPHONE HCL 2 MG/ML IJ SOLN
INTRAMUSCULAR | Status: AC
  Filled 2024-01-18: qty 1

## 2024-01-18 MED ORDER — 0.9 % SODIUM CHLORIDE (POUR BTL) OPTIME
TOPICAL | Status: DC | PRN
  Administered 2024-01-18: 1000 mL

## 2024-01-18 MED ORDER — ACETAMINOPHEN 500 MG PO TABS
1000.0000 mg | ORAL_TABLET | Freq: Four times a day (QID) | ORAL | Status: DC
  Administered 2024-01-18 – 2024-01-21 (×11): 1000 mg via ORAL
  Filled 2024-01-18 (×12): qty 2

## 2024-01-18 MED ORDER — DEXMEDETOMIDINE HCL IN NACL 80 MCG/20ML IV SOLN
INTRAVENOUS | Status: DC | PRN
Start: 2024-01-18 — End: 2024-01-18
  Administered 2024-01-18: 8 ug via INTRAVENOUS

## 2024-01-18 MED ORDER — OXYCODONE HCL 5 MG/5ML PO SOLN
ORAL | Status: AC
  Filled 2024-01-18: qty 5

## 2024-01-18 MED ORDER — ENSURE SURGERY PO LIQD
237.0000 mL | Freq: Two times a day (BID) | ORAL | Status: DC
Start: 2024-01-18 — End: 2024-01-21
  Administered 2024-01-19 – 2024-01-20 (×3): 237 mL via ORAL

## 2024-01-18 MED ORDER — MIDAZOLAM HCL 5 MG/5ML IJ SOLN
INTRAMUSCULAR | Status: DC | PRN
  Administered 2024-01-18 (×2): 1 mg via INTRAVENOUS

## 2024-01-18 MED ORDER — TRAMADOL HCL 50 MG PO TABS
50.0000 mg | ORAL_TABLET | Freq: Four times a day (QID) | ORAL | Status: DC | PRN
  Administered 2024-01-18 – 2024-01-20 (×6): 50 mg via ORAL
  Filled 2024-01-18 (×6): qty 1

## 2024-01-18 MED ORDER — NEOMYCIN SULFATE 500 MG PO TABS: 1000.0000 mg | ORAL_TABLET | ORAL | Status: DC

## 2024-01-18 MED ORDER — DIPHENHYDRAMINE HCL 12.5 MG/5ML PO ELIX: 12.5000 mg | ORAL_SOLUTION | Freq: Four times a day (QID) | ORAL | Status: DC | PRN

## 2024-01-18 MED ORDER — SODIUM CHLORIDE (PF) 0.9 % IJ SOLN
INTRAMUSCULAR | Status: AC
  Filled 2024-01-18: qty 20

## 2024-01-18 MED ORDER — ENSURE PRE-SURGERY PO LIQD
592.0000 mL | Freq: Once | ORAL | Status: DC
  Filled 2024-01-18: qty 592

## 2024-01-18 MED ORDER — ROCURONIUM BROMIDE 100 MG/10ML IV SOLN
INTRAVENOUS | Status: DC | PRN
  Administered 2024-01-18 (×3): 10 mg via INTRAVENOUS
  Administered 2024-01-18: 30 mg via INTRAVENOUS

## 2024-01-18 MED ORDER — OXYCODONE HCL 5 MG PO TABS: 5.0000 mg | ORAL_TABLET | Freq: Once | ORAL | Status: AC | PRN

## 2024-01-18 SURGICAL SUPPLY — 58 items
BAG COUNTER SPONGE SURGICOUNT (BAG) IMPLANT
BLADE EXTENDED COATED 6.5IN (ELECTRODE) IMPLANT
CABLE HIGH FREQUENCY MONO STRZ (ELECTRODE) IMPLANT
CATH MUSHROOM 30FR (CATHETERS) IMPLANT
CLIP APPLIE 5 13 M/L LIGAMAX5 (MISCELLANEOUS) IMPLANT
CLIP APPLIE ROT 10 11.4 M/L (STAPLE) IMPLANT
DERMABOND ADVANCED .7 DNX12 (GAUZE/BANDAGES/DRESSINGS) IMPLANT
DISSECTOR BLUNT TIP ENDO 5MM (MISCELLANEOUS) IMPLANT
DRAIN CHANNEL 19F RND (DRAIN) IMPLANT
DRAPE SURG IRRIG POUCH 19X23 (DRAPES) ×1 IMPLANT
DRSG OPSITE POSTOP 4X10 (GAUZE/BANDAGES/DRESSINGS) IMPLANT
DRSG OPSITE POSTOP 4X6 (GAUZE/BANDAGES/DRESSINGS) IMPLANT
DRSG OPSITE POSTOP 4X8 (GAUZE/BANDAGES/DRESSINGS) IMPLANT
ELECT REM PT RETURN 15FT ADLT (MISCELLANEOUS) ×1 IMPLANT
EVACUATOR SILICONE 100CC (DRAIN) IMPLANT
GAUZE SPONGE 4X4 12PLY STRL (GAUZE/BANDAGES/DRESSINGS) IMPLANT
GLOVE BIO SURGEON STRL SZ7.5 (GLOVE) ×2 IMPLANT
GLOVE INDICATOR 8.0 STRL GRN (GLOVE) ×2 IMPLANT
GOWN STRL REUS W/ TWL XL LVL3 (GOWN DISPOSABLE) ×4 IMPLANT
HOLDER FOLEY CATH W/STRAP (MISCELLANEOUS) ×1 IMPLANT
IRRIGATION SUCT STRKRFLW 2 WTP (MISCELLANEOUS) ×1 IMPLANT
KIT TURNOVER KIT A (KITS) ×1 IMPLANT
LIGASURE IMPACT 36 18CM CVD LR (INSTRUMENTS) IMPLANT
NDL INSUFFLATION 14GA 120MM (NEEDLE) IMPLANT
NEEDLE INSUFFLATION 14GA 120MM (NEEDLE) IMPLANT
PACK COLON (CUSTOM PROCEDURE TRAY) ×1 IMPLANT
PAD POSITIONING PINK XL (MISCELLANEOUS) ×1 IMPLANT
PENCIL SMOKE EVACUATOR (MISCELLANEOUS) IMPLANT
RELOAD STAPLE 75 3.8 BLU REG (ENDOMECHANICALS) IMPLANT
SCISSORS LAP 5X35 DISP (ENDOMECHANICALS) ×1 IMPLANT
SEALER TISSUE G2 STRG ARTC 35C (ENDOMECHANICALS) ×1 IMPLANT
SET TUBE SMOKE EVAC HIGH FLOW (TUBING) ×1 IMPLANT
SLEEVE ADV FIXATION 5X100MM (TROCAR) ×2 IMPLANT
SPIKE FLUID TRANSFER (MISCELLANEOUS) ×1 IMPLANT
SPONGE DRAIN TRACH 4X4 STRL 2S (GAUZE/BANDAGES/DRESSINGS) IMPLANT
STAPLER 90 3.5 STD SLIM (STAPLE) IMPLANT
STAPLER GUN LINEAR PROX 60 (STAPLE) IMPLANT
STAPLER PROXIMATE 75MM BLUE (STAPLE) IMPLANT
STAPLER SKIN PROX 35W (STAPLE) IMPLANT
SUT ETHILON 3 0 PS 1 (SUTURE) IMPLANT
SUT PDS AB 1 CT1 27 (SUTURE) ×2 IMPLANT
SUT PDS AB 1 TP1 54 (SUTURE) IMPLANT
SUT PROLENE 2 0 KS (SUTURE) ×1 IMPLANT
SUT PROLENE 2 0 SH DA (SUTURE) IMPLANT
SUT SILK 2 0 SH CR/8 (SUTURE) ×1 IMPLANT
SUT SILK 2-0 18XBRD TIE 12 (SUTURE) ×1 IMPLANT
SUT SILK 3 0 SH CR/8 (SUTURE) ×1 IMPLANT
SUT SILK 3-0 18XBRD TIE 12 (SUTURE) ×1 IMPLANT
SUT VIC AB 2-0 SH 27X BRD (SUTURE) IMPLANT
SUT VIC AB 3-0 SH 18 (SUTURE) IMPLANT
SUT VIC AB 3-0 SH 27X BRD (SUTURE) IMPLANT
SUT VICRYL AB 2 0 TIES (SUTURE) ×1 IMPLANT
SYSTEM LAPSCP GELPORT 120MM (MISCELLANEOUS) IMPLANT
SYSTEM WOUND ALEXIS 18CM MED (MISCELLANEOUS) IMPLANT
TOWEL OR 17X26 10 PK STRL BLUE (TOWEL DISPOSABLE) IMPLANT
TRAY IRRIG W/60CC SYR STRL (SET/KITS/TRAYS/PACK) ×1 IMPLANT
TROCAR ADV FIXATION 5X100MM (TROCAR) ×1 IMPLANT
TROCAR BALLN 12MMX100 BLUNT (TROCAR) IMPLANT

## 2024-01-18 NOTE — Anesthesia Procedure Notes (Signed)
 Procedure Name: Intubation Date/Time: 01/18/2024 9:37 AM  Performed by: Kathern Rollene LABOR, CRNAPre-anesthesia Checklist: Patient identified, Emergency Drugs available, Suction available and Patient being monitored Patient Re-evaluated:Patient Re-evaluated prior to induction Oxygen Delivery Method: Circle system utilized Preoxygenation: Pre-oxygenation with 100% oxygen Induction Type: IV induction, Cricoid Pressure applied and Rapid sequence Ventilation: Mask ventilation without difficulty Laryngoscope Size: Mac and 3 Grade View: Grade I Tube type: Oral Tube size: 7.5 mm Number of attempts: 1 Airway Equipment and Method: Stylet Placement Confirmation: ETT inserted through vocal cords under direct vision, positive ETCO2 and breath sounds checked- equal and bilateral Secured at: 23 cm Tube secured with: Tape Dental Injury: Teeth and Oropharynx as per pre-operative assessment

## 2024-01-18 NOTE — Anesthesia Postprocedure Evaluation (Signed)
 Anesthesia Post Note  Patient: William Blankenship  Procedure(s) Performed: EXCISION, CECUM WITH ILEUM, LAPAROSCOPIC (Right)     Patient location during evaluation: PACU Anesthesia Type: General Level of consciousness: awake and alert Pain management: pain level controlled Vital Signs Assessment: post-procedure vital signs reviewed and stable Respiratory status: spontaneous breathing, nonlabored ventilation, respiratory function stable and patient connected to nasal cannula oxygen Cardiovascular status: blood pressure returned to baseline and stable Postop Assessment: no apparent nausea or vomiting Anesthetic complications: no   No notable events documented.  Last Vitals:  Vitals:   01/18/24 1245 01/18/24 1300  BP: 129/79 126/80  Pulse: 64 (!) 58  Resp: 12 15  Temp:    SpO2: 96% 96%    Last Pain:  Vitals:   01/18/24 1308  TempSrc:   PainSc: 7                  Jailey Booton S

## 2024-01-18 NOTE — Op Note (Signed)
 PATIENT: William Blankenship  48 y.o. male  Patient Care Team: Pcp, No as PCP - General  PREOP DIAGNOSIS: Ascending colon cancer  POSTOP DIAGNOSIS: Same  PROCEDURE: Laparoscopic right hemicolectomy  SURGEON: Lonni HERO. Teresa, MD  ASSISTANT: Cordella Idler, MD  An experienced assistant was required given the complexity of this procedure and the standard of surgical care. My assistant helped with exposure through counter tension, suctioning, ligation and retraction to better visualize the surgical field. My assistant expedited sewing during the case by following my sutures. Wherever I use the term we in the report, my assistant actively helped me with that portion of the procedure.   ANESTHESIA: General endotracheal  EBL: 30 mL Total I/O In: 350 [IV Piggyback:350] Out: 305 [Urine:275; Blood:30]  DRAINS: none  SPECIMEN:  Right colon (including terminal ileum, cecum/appendix, ascending colon, proximal transverse colon) Mesenteric nodule  COUNTS: Sponge, needle and instrument counts were reported correct x2  FINDINGS: Large mass in ascending colon as expected. No evident metastatic disease on visceral parietal peritoneum or liver. Laparoscopic right hemicolectomy carried out uneventfully.  NARRATIVE:  The patient was seen in the pre-op holding area. The risks, benefits, complications, treatment options, and expected outcomes were previously discussed with the patient. The patient agreed with the proposed plan and has signed the informed consent form. The patient was brought to the operating room by the surgical team, identified as William Blankenship, and the procedure verified. placed supine on the operating table and SCD's were applied. General endotracheal anesthesia was induced without difficulty. He was then positioned supine with arms tucked.  Pressure points were evaluated and padded.  A foley catheter was then placed by nursing under sterile conditions. Hair on the  abdomen was clipped.  He was secured to the operating table. The abdomen was then prepped and draped in the standard sterile fashion. A time out was completed and the above information confirmed and need for preoperative antibiotics.  Beginning with the extraction port, a supraumbilical incision was made and carried down to the midline fascia. This was then incised with electrocautery. The peritoneum was identified and elevated between clamps and carefully opened sharply. A small Alexis wound protector with a cap and associated port was then placed. The abdomen was insufflated to 15 mmHg with Co2. A laparoscope was placed and camera inspection revealed no evidence of injury. Bilateral TAP blocks were then performed under laparoscopic visualization using a mixture of 0.25% marcaine  with epinepherine + Exparel . Three additional ports were then placed under direct laparoscopic visualization - two in the left hemiabdomen and one in the right abdomen. The abdomen was surveyed.  There is a large mass in the ascending colon with tattoo distal to that as expected.  The surface of the liver and peritoneum appeared normal--no evidence of metastatic disease.  There is notable bulky adenopathy involving the ileocolic chain.  He was positioned in trendelenburg with left side down.  We first decided to proceed with a lateral to medial based approach to ensure that there is nothing else tethered to this mass.  We are able to identify the duodenum medially through the mesentery and it does appear to be free and independently mobile.  The appendix is relatively normal in appearance.  We first began by mobilizing the appendix and cecum by incising the Wynette Jersey line of Toldt.  The associated terminal ileum mesentery is also mobilized medially.  The ascending colon is mobilized all the way up to the hepatic flexure by incising the Miyoshi Ligas line of Toldt  and mobilized the associated mesocolon medially.  It does not appear this mass  involves anything around this area.  Attention is then directed at the medial to lateral approach.  The ileocolic pedicle was identified. Gentle blunt dissection commenced around the pedicle and the duodenum was identified and freed from the surrounding structures.  We were able to remove all visible adenopathy with our planned ileocolic ligation.  After circumferential dissection ileocolic pedicle and ensuring the duodenum was well away from it, it is ligated and divided using the laparoscopic Enseal device.  The cut edges inspected and noted to be hemostatic with good seal.  Duodenum has been completely freed of the overlying mesocolon.  We are able to mobilize the mesocolon all the way up to the level of the hepatic flexure.  He is then repositioned in reverse Trendelenburg.  The omentum is elevated anteriorly and the transverse colon is retracted caudad.  We are able to incise at this location which is at the midportion of the transverse colon and gain access to the lesser sac.  The omentum was then carefully dissected free of the transverse colon all the way out to the hepatic flexure.  The hepatic flexure was then fully mobilized from this approach as well.  In doing so, we were able to remove the appendix in the left upper quadrant without any difficulty and have achieved more than satisfactory mobilization.  At this point, the abdomen was desufflated and the terminal ileum and right colon delivered through the wound protector. The terminal ileum was then transected using a GIA blue load stapler.  An Allis clamp was placed on the proximal side of the ileal staple line.  This was done to assist in maintaining orientation.  The remaining mesentery was divided using the Enseal device. The divided mesentery was inspected and noted to be hemostatic. The distal point of transection was then identified on the transverse colon at a location that included the right branch of the middle colics, leaving the main  middle colic feeding the remaining transverse colon.  There is no palpable middle colic adenopathy.  Mesentery is ligated similarly using Insta device including the right branch middle colic vessel.  Transverse colon this location was transected using another blue load GIA stapler.  The specimen was then passed off.  Attention was turned to creating the anastomosis. The distal ileum and transverse colon were inspected for orientation to ensure no twisting nor bowel included in the mesenteric defect. An anastomosis was created between the terminal ileum and the transverse colon using a 75 mm GIA blue load stapler. The staple line was inspected and noted to be hemostatic.  The common enterotomy channel was closed using a TA 60 blue load stapler. Hemostasis was achieved at the staple line using 3-0 silk U-stitches. 3-0 silk sutures were used to imbricate the corners of the staple line as well.  A 2-0 silk suture was placed securing the crotch of the anastomosis. The anastomosis was palpated and noted to be widely patent. This was then placed back into the abdomen. The abdomen was then irrigated with sterile saline and hemostasis verified. The omentum was then brought down over the anastomosis. The wound protector cap was replaced and CO2 reinsufflated. The laparoscopic ports were removed under direct visualization and the sites noted to be hemostatic. The Alexis wound protector was removed, counts were reported correct, and we switched to clean instruments, gowns and drapes.   The midline fascia was then closed using two running #1 PDS  sutures.  The skin of all incision sites was closed with 4-0 monocryl subcuticular suture. Dermabond was placed on the port sites and a sterile dressing was placed over the abdominal incision. All counts were reported correct. The patient was then awakened from anesthesia and sent to the post anesthesia care unit in stable condition.   DISPOSITION: PACU in satisfactory  condition

## 2024-01-18 NOTE — Discharge Instructions (Addendum)

## 2024-01-18 NOTE — Anesthesia Procedure Notes (Signed)
 Procedure Name: Intubation Date/Time: 01/18/2024 9:37 AM  Performed by: Kathern Rollene LABOR, CRNAPre-anesthesia Checklist: Patient identified, Emergency Drugs available, Suction available and Patient being monitored Patient Re-evaluated:Patient Re-evaluated prior to induction Oxygen Delivery Method: Circle system utilized Preoxygenation: Pre-oxygenation with 100% oxygen Induction Type: IV induction Ventilation: Mask ventilation without difficulty Laryngoscope Size: Mac and 3 Grade View: Grade I Tube type: Oral Tube size: 7.5 mm Number of attempts: 1 Airway Equipment and Method: Stylet Placement Confirmation: ETT inserted through vocal cords under direct vision, positive ETCO2 and breath sounds checked- equal and bilateral Secured at: 23 cm Tube secured with: Tape Dental Injury: Teeth and Oropharynx as per pre-operative assessment

## 2024-01-18 NOTE — Plan of Care (Signed)
  Problem: Education: Goal: Understanding of discharge needs will improve Outcome: Progressing Goal: Verbalization of understanding of the causes of altered bowel function will improve Outcome: Progressing   Problem: Activity: Goal: Ability to tolerate increased activity will improve Outcome: Progressing

## 2024-01-18 NOTE — Transfer of Care (Signed)
 Immediate Anesthesia Transfer of Care Note  Patient: William Blankenship  Procedure(s) Performed: EXCISION, CECUM WITH ILEUM, LAPAROSCOPIC (Right)  Patient Location: PACU  Anesthesia Type:General  Level of Consciousness: oriented, drowsy, and patient cooperative  Airway & Oxygen Therapy: Patient Spontanous Breathing and Patient connected to face mask oxygen  Post-op Assessment: Report given to RN and Post -op Vital signs reviewed and stable  Post vital signs: Reviewed and stable  Last Vitals:  Vitals Value Taken Time  BP 113/71 01/18/24 12:10  Temp    Pulse 65 01/18/24 12:13  Resp 21 01/18/24 12:13  SpO2 100 % 01/18/24 12:13  Vitals shown include unfiled device data.  Last Pain:  Vitals:   01/18/24 0815  TempSrc:   PainSc: 4       Patients Stated Pain Goal: 4 (01/18/24 0815)  Complications: No notable events documented.

## 2024-01-18 NOTE — H&P (Signed)
 CC: Here today for surgery  HPI: William Blankenship is an 48 y.o. male with history of allergies, whom is seen in the office today as a referral by Dr. Lanny for evaluation of large right-sided colon mass.   Reports history of intermittent right sided abdominal pain which is occasionally sharp/knifelike and has been going on for a few months. He notes an approximate 1 year history of occasional right sided pains/cramps. He is also noted an unintentional 22 pound weight loss. He does note some decreased appetite however. Eats about three fourths of each of his meals currently.  Has on average 10-13 loose watery bowel movements per day.  He recently met with Dr. Lanny 12/27/23. Urgent referrals were placed to our office for surgical consideration and to [facility] for colonoscopy.  CT Chest/abd/pelvis 12/24/23  Annular soft tissue mass involving the ascending colon, consistent with primary colon carcinoma.  Mild pericolonic and right ileocolic mesenteric lymphadenopathy, consistent with metastatic disease.  No evidence of distant metastatic disease.  He has a colonoscopy in the works with [facility]. It appears this is scheduled for 01/16/2024.  Currently, he reports he has been doing reasonably well. He denies any abdominal pain at present. He does report that he has had loose/watery stool for at least the last 4 months. He is here today with his son.  Colonoscopy 01/16/2024 showed large tumor of ascending colon, biopsied, tattoo'd distally. Diverticulosis in sigmoid; internal hemorrhoids. 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 No new medications/allergies. He states he is ready for surgery today.   PMH: Allergies  PSH: Denies any prior surgical history  FHx: Denies any known family history of colorectal, breast, endometrial or ovarian cancer  Social Hx: Denies use of  tobacco/illicit drug. ~2 EtOH drinks in a week. Works locally for a [employer]- [employer]. He is here today with his son.    Past Medical History:  Diagnosis Date   Abrasion of anterior lower leg 07/29/2012   Anemia    Back pain    Colonic mass    GERD (gastroesophageal reflux disease)    Hand injury 03/31/2013   Hyperlipidemia    Migraines    PATELLAR DISLOCATION, LEFT 01/31/2010   Qualifier: Diagnosis of  By: Rosalynn MD, Camie      Past Surgical History:  Procedure Laterality Date   conoscopy       Family History  Problem Relation Age of Onset   Colon cancer Neg Hx    Colon polyps Neg Hx    Esophageal cancer Neg Hx    Rectal cancer Neg Hx    Stomach cancer Neg Hx     Social:  reports that he has quit smoking. His smoking use included cigarettes. He has been exposed to tobacco smoke. He has never used smokeless tobacco. He reports current alcohol use. He reports that he does not use drugs.  Allergies: No Known Allergies  Medications: I have reviewed the patient's current medications.  Results for orders placed or performed during the hospital encounter of 01/17/24 (from the past 48 hours)  CBC with Differential     Status: Abnormal   Collection Time: 01/17/24  2:50 PM  Result Value Ref Range   WBC 15.0 (H) 4.0 - 10.5 K/uL   RBC 4.83 4.22 - 5.81 MIL/uL   Hemoglobin 9.9 (L) 13.0 - 17.0 g/dL   HCT 65.8 (L) 60.9 - 47.9 %  MCV 70.6 (L) 80.0 - 100.0 fL   MCH 20.5 (L) 26.0 - 34.0 pg   MCHC 29.0 (L) 30.0 - 36.0 g/dL   RDW 81.3 (H) 88.4 - 84.4 %   Platelets 542 (H) 150 - 400 K/uL   nRBC 0.0 0.0 - 0.2 %   Neutrophils Relative % 67 %   Neutro Abs 9.9 (H) 1.7 - 7.7 K/uL   Lymphocytes Relative 19 %   Lymphs Abs 2.9 0.7 - 4.0 K/uL   Monocytes Relative 13 %   Monocytes Absolute 2.0 (H) 0.1 - 1.0 K/uL   Eosinophils Relative 1 %   Eosinophils Absolute 0.1 0.0 - 0.5 K/uL   Basophils Relative 0 %   Basophils Absolute 0.0 0.0 - 0.1 K/uL   Immature Granulocytes 0 %    Abs Immature Granulocytes 0.06 0.00 - 0.07 K/uL    Comment: Performed at [facility], [address], [place], [place] [address]  Comprehensive metabolic panel     Status: Abnormal   Collection Time: 01/17/24  2:50 PM  Result Value Ref Range   Sodium 137 135 - 145 mmol/L   Potassium 3.8 3.5 - 5.1 mmol/L   Chloride 105 98 - 111 mmol/L   CO2 23 22 - 32 mmol/L   Glucose, Bld 90 70 - 99 mg/dL    Comment: Glucose reference range applies only to samples taken after fasting for at least 8 hours.   BUN 9 6 - 20 mg/dL   Creatinine, Ser 8.78 0.61 - 1.24 mg/dL   Calcium 8.5 (L) 8.9 - 10.3 mg/dL   Total Protein 6.2 (L) 6.5 - 8.1 g/dL   Albumin  3.1 (L) 3.5 - 5.0 g/dL   AST 18 15 - 41 U/L   ALT 17 0 - 44 U/L   Alkaline Phosphatase 65 38 - 126 U/L   Total Bilirubin 0.4 0.0 - 1.2 mg/dL   GFR, Estimated >39 >39 mL/min    Comment: (NOTE) Calculated using the CKD-EPI Creatinine Equation (2021)    Anion gap 9 5 - 15    Comment: Performed at [facility], [address] [place], [place] [address]  Type and screen     Status: None   Collection Time: 01/17/24  2:50 PM  Result Value Ref Range   ABO/RH(D) O POS    Antibody Screen NEG    Sample Expiration 01/31/2024,2359    Extend sample reason      NO TRANSFUSIONS OR PREGNANCY IN THE PAST 3 MONTHS Performed at [facility], [address] [place], [place] [address]     No results found.   PE There were no vitals taken for this visit. Constitutional: NAD; conversant Eyes: Moist conjunctiva; no lid lag; anicteric Lungs: Normal respiratory effort CV: RRR GI: Abd soft, NT/ND; no palpable hepatosplenomegaly Psychiatric: Appropriate affect  Results for orders placed or performed during the hospital encounter of 01/17/24 (from the past 48 hours)  CBC with Differential     Status: Abnormal   Collection Time: 01/17/24  2:50 PM  Result Value Ref Range   WBC 15.0 (H) 4.0 - 10.5 K/uL   RBC  4.83 4.22 - 5.81 MIL/uL   Hemoglobin 9.9 (L) 13.0 - 17.0 g/dL   HCT 65.8 (L) 60.9 - 47.9 %   MCV 70.6 (L) 80.0 - 100.0 fL   MCH 20.5 (L) 26.0 - 34.0 pg   MCHC 29.0 (L) 30.0 - 36.0 g/dL   RDW 81.3 (H) 88.4 - 84.4 %  Platelets 542 (H) 150 - 400 K/uL   nRBC 0.0 0.0 - 0.2 %   Neutrophils Relative % 67 %   Neutro Abs 9.9 (H) 1.7 - 7.7 K/uL   Lymphocytes Relative 19 %   Lymphs Abs 2.9 0.7 - 4.0 K/uL   Monocytes Relative 13 %   Monocytes Absolute 2.0 (H) 0.1 - 1.0 K/uL   Eosinophils Relative 1 %   Eosinophils Absolute 0.1 0.0 - 0.5 K/uL   Basophils Relative 0 %   Basophils Absolute 0.0 0.0 - 0.1 K/uL   Immature Granulocytes 0 %   Abs Immature Granulocytes 0.06 0.00 - 0.07 K/uL    Comment: Performed at [facility], [address], [place], [place] [address]  Comprehensive metabolic panel     Status: Abnormal   Collection Time: 01/17/24  2:50 PM  Result Value Ref Range   Sodium 137 135 - 145 mmol/L   Potassium 3.8 3.5 - 5.1 mmol/L   Chloride 105 98 - 111 mmol/L   CO2 23 22 - 32 mmol/L   Glucose, Bld 90 70 - 99 mg/dL    Comment: Glucose reference range applies only to samples taken after fasting for at least 8 hours.   BUN 9 6 - 20 mg/dL   Creatinine, Ser 8.78 0.61 - 1.24 mg/dL   Calcium 8.5 (L) 8.9 - 10.3 mg/dL   Total Protein 6.2 (L) 6.5 - 8.1 g/dL   Albumin  3.1 (L) 3.5 - 5.0 g/dL   AST 18 15 - 41 U/L   ALT 17 0 - 44 U/L   Alkaline Phosphatase 65 38 - 126 U/L   Total Bilirubin 0.4 0.0 - 1.2 mg/dL   GFR, Estimated >39 >39 mL/min    Comment: (NOTE) Calculated using the CKD-EPI Creatinine Equation (2021)    Anion gap 9 5 - 15    Comment: Performed at [facility], [address], [place], [place] [address]  Type and screen     Status: None   Collection Time: 01/17/24  2:50 PM  Result Value Ref Range   ABO/RH(D) O POS    Antibody Screen NEG    Sample Expiration 01/31/2024,2359    Extend sample reason      NO TRANSFUSIONS OR PREGNANCY IN  THE PAST 3 MONTHS Performed at [facility], [address] [place], [place] [address]     No results found.  A/P: William Blankenship is an 48 y.o. male with hx of allergies here for evaluation of right sided colon mass  - CT chest/abdomen/pelvis 12/24/2023 demonstrated a large mass in the ascending colon without evidence of metastatic disease - constellation worrisome for large colon cancer  - Colonoscopy 9/24 showed large tumor of ascending colon, biopsied, tattoo'd distally. Diverticulosis in sigmoid; internal hemorrhoids. 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 The pathophysiology of colon cancer was discussed as well with associated pictures. -We have discussed various different treatment options going forward including surgery (the most definitive) to address this -laparoscopic versus open right hemicolectomy -The planned procedure, material risks (including, but not limited to, pain, bleeding, infection, scarring, need for blood transfusion, damage to surrounding structures- blood vessels/nerves/viscus/organs, damage to ureter, urine leak, leak from anastomosis, need for additional procedures, scenarios where a stoma may be necessary and where it may be permanent, worsening of pre-existing medical conditions, chronic diarrhea, constipation secondary to narcotic use, hernia, recurrence, blood clot, pulmonary  embolus, pneumonia, heart attack, stroke, death) benefits and alternatives to surgery were discussed at length. The patient's questions were answered to his satisfaction, he voiced understanding and elected to proceed with surgery. Additionally, we discussed typical postoperative expectations and the recovery process.  Of note, we have offered the patient a Spanish interpreter and he has willfully refused.  Lonni Pizza, MD [facility]  [facility]

## 2024-01-18 NOTE — Telephone Encounter (Signed)
Thank you Patty 

## 2024-01-18 NOTE — Anesthesia Preprocedure Evaluation (Signed)
 Anesthesia Evaluation  Patient identified by MRN, date of birth, ID band Patient awake    Reviewed: Allergy & Precautions, H&P , NPO status , Patient's Chart, lab work & pertinent test results  Airway Mallampati: II   Neck ROM: full    Dental   Pulmonary former smoker   breath sounds clear to auscultation       Cardiovascular negative cardio ROS  Rhythm:regular Rate:Normal     Neuro/Psych  Headaches    GI/Hepatic ,GERD  ,,  Endo/Other    Renal/GU      Musculoskeletal   Abdominal   Peds  Hematology  (+) Blood dyscrasia, anemia Hemoglobin 9.9   Anesthesia Other Findings   Reproductive/Obstetrics                              Anesthesia Physical Anesthesia Plan  ASA: 2  Anesthesia Plan: General   Post-op Pain Management:    Induction: Intravenous  PONV Risk Score and Plan: 2 and Ondansetron , Dexamethasone , Midazolam  and Treatment may vary due to age or medical condition  Airway Management Planned: Oral ETT  Additional Equipment:   Intra-op Plan:   Post-operative Plan: Extubation in OR  Informed Consent: I have reviewed the patients History and Physical, chart, labs and discussed the procedure including the risks, benefits and alternatives for the proposed anesthesia with the patient or authorized representative who has indicated his/her understanding and acceptance.     Dental advisory given  Plan Discussed with: CRNA, Anesthesiologist and Surgeon  Anesthesia Plan Comments:         Anesthesia Quick Evaluation

## 2024-01-19 ENCOUNTER — Encounter (HOSPITAL_COMMUNITY): Payer: Self-pay | Admitting: Surgery

## 2024-01-19 LAB — CBC
HCT: 30.6 % — ABNORMAL LOW (ref 39.0–52.0)
Hemoglobin: 8.8 g/dL — ABNORMAL LOW (ref 13.0–17.0)
MCH: 20.3 pg — ABNORMAL LOW (ref 26.0–34.0)
MCHC: 28.8 g/dL — ABNORMAL LOW (ref 30.0–36.0)
MCV: 70.5 fL — ABNORMAL LOW (ref 80.0–100.0)
Platelets: 494 K/uL — ABNORMAL HIGH (ref 150–400)
RBC: 4.34 MIL/uL (ref 4.22–5.81)
RDW: 18.5 % — ABNORMAL HIGH (ref 11.5–15.5)
WBC: 16.9 K/uL — ABNORMAL HIGH (ref 4.0–10.5)
nRBC: 0 % (ref 0.0–0.2)

## 2024-01-19 LAB — BASIC METABOLIC PANEL WITH GFR
Anion gap: 9 (ref 5–15)
BUN: 5 mg/dL — ABNORMAL LOW (ref 6–20)
CO2: 23 mmol/L (ref 22–32)
Calcium: 8.6 mg/dL — ABNORMAL LOW (ref 8.9–10.3)
Chloride: 105 mmol/L (ref 98–111)
Creatinine, Ser: 0.89 mg/dL (ref 0.61–1.24)
GFR, Estimated: >60 mL/min (ref >60)
Glucose, Bld: 109 mg/dL — ABNORMAL HIGH (ref 70–99)
Potassium: 5 mmol/L (ref 3.5–5.1)
Sodium: 137 mmol/L (ref 135–145)

## 2024-01-19 NOTE — Plan of Care (Signed)
  Problem: Education: Goal: Understanding of discharge needs will improve Outcome: Progressing   Problem: Education: Goal: Knowledge of General Education information will improve Description: Including pain rating scale, medication(s)/side effects and non-pharmacologic comfort measures Outcome: Progressing   Problem: Nutrition: Goal: Adequate nutrition will be maintained Outcome: Progressing   Problem: Safety: Goal: Ability to remain free from injury will improve Outcome: Progressing

## 2024-01-19 NOTE — Plan of Care (Signed)
  Problem: Bowel/Gastric: Goal: Gastrointestinal status for postoperative course will improve Outcome: Progressing   Problem: Clinical Measurements: Goal: Postoperative complications will be avoided or minimized Outcome: Progressing   Problem: Clinical Measurements: Goal: Ability to maintain clinical measurements within normal limits will improve Outcome: Progressing   Problem: Pain Managment: Goal: General experience of comfort will improve and/or be controlled Outcome: Progressing

## 2024-01-19 NOTE — Progress Notes (Signed)
 1 Day Post-Op   Subjective/Chief Complaint: Patient feels well.  No bowel movement yet but having flatus.   Objective: Vital signs in last 24 hours: Temp:  [97.4 F (36.3 C)-98.4 F (36.9 C)] 98 F (36.7 C) (09/27 0851) Pulse Rate:  [53-70] 56 (09/27 0851) Resp:  [12-21] 18 (09/27 0851) BP: (113-129)/(71-83) 126/77 (09/27 0851) SpO2:  [95 %-100 %] 100 % (09/27 0851) Last BM Date : 01/18/24  Intake/Output from previous day: 09/26 0701 - 09/27 0700 In: 3915.3 [P.O.:1260; I.V.:2305.3; IV Piggyback:350] Out: 4455 [Urine:4425; Blood:30] Intake/Output this shift: Total I/O In: 20 [P.O.:20] Out: -   General appearance: alert Resp: clear to auscultation bilaterally Cardio: RRR Incision/Wound:  CDI  SOFT ND   Lab Results:  Recent Labs    01/17/24 1450 01/19/24 0438  WBC 15.0* 16.9*  HGB 9.9* 8.8*  HCT 34.1* 30.6*  PLT 542* 494*   BMET Recent Labs    01/17/24 1450 01/19/24 0438  NA 137 137  K 3.8 5.0  CL 105 105  CO2 23 23  GLUCOSE 90 109*  BUN 9 5*  CREATININE 1.21 0.89  CALCIUM 8.5* 8.6*   PT/INR No results for input(s): LABPROT, INR in the last 72 hours. ABG No results for input(s): PHART, HCO3 in the last 72 hours.  Invalid input(s): PCO2, PO2  Studies/Results: No results found.  Anti-infectives: Anti-infectives (From admission, onward)    Start     Dose/Rate Route Frequency Ordered Stop   01/18/24 1400  neomycin  (MYCIFRADIN ) tablet 1,000 mg  Status:  Discontinued       Placed in And Linked Group   1,000 mg Oral 3 times per day 01/18/24 0744 01/18/24 0746   01/18/24 1400  metroNIDAZOLE  (FLAGYL ) tablet 1,000 mg  Status:  Discontinued       Placed in And Linked Group   1,000 mg Oral 3 times per day 01/18/24 0744 01/18/24 0746   01/18/24 0745  cefoTEtan  (CEFOTAN ) 2 g in sodium chloride  0.9 % 100 mL IVPB        2 g 200 mL/hr over 30 Minutes Intravenous On call to O.R. 01/18/24 0744 01/18/24 0947       Assessment/Plan: s/p  Procedure(s) with comments: EXCISION, CECUM WITH ILEUM, LAPAROSCOPIC (Right) - LAPAROSCOPIC RIGHT HEMICOLECTOMY  Doing well Adv diet         LOS: 1 day    Debby DELENA Shipper MD  01/19/2024

## 2024-01-20 LAB — BASIC METABOLIC PANEL WITH GFR
Anion gap: 6 (ref 5–15)
BUN: 6 mg/dL (ref 6–20)
CO2: 25 mmol/L (ref 22–32)
Calcium: 8.5 mg/dL — ABNORMAL LOW (ref 8.9–10.3)
Chloride: 106 mmol/L (ref 98–111)
Creatinine, Ser: 0.88 mg/dL (ref 0.61–1.24)
GFR, Estimated: >60 mL/min (ref >60)
Glucose, Bld: 89 mg/dL (ref 70–99)
Potassium: 4.6 mmol/L (ref 3.5–5.1)
Sodium: 136 mmol/L (ref 135–145)

## 2024-01-20 LAB — CBC
HCT: 29.8 % — ABNORMAL LOW (ref 39.0–52.0)
Hemoglobin: 8.3 g/dL — ABNORMAL LOW (ref 13.0–17.0)
MCH: 19.9 pg — ABNORMAL LOW (ref 26.0–34.0)
MCHC: 27.9 g/dL — ABNORMAL LOW (ref 30.0–36.0)
MCV: 71.5 fL — ABNORMAL LOW (ref 80.0–100.0)
Platelets: 485 K/uL — ABNORMAL HIGH (ref 150–400)
RBC: 4.17 MIL/uL — ABNORMAL LOW (ref 4.22–5.81)
RDW: 18.8 % — ABNORMAL HIGH (ref 11.5–15.5)
WBC: 9.6 K/uL (ref 4.0–10.5)
nRBC: 0 % (ref 0.0–0.2)

## 2024-01-20 MED ORDER — HEPARIN SODIUM (PORCINE) 5000 UNIT/ML IJ SOLN
5000.0000 [IU] | Freq: Three times a day (TID) | INTRAMUSCULAR | Status: DC
  Administered 2024-01-20 – 2024-01-21 (×3): 5000 [IU] via SUBCUTANEOUS
  Filled 2024-01-20 (×3): qty 1

## 2024-01-20 NOTE — Progress Notes (Signed)
 2 Days Post-Op   Subjective/Chief Complaint: Feels ok not much flatus no bm tol diet pain controlled   Objective: Vital signs in last 24 hours: Temp:  [97 F (36.1 C)-98.1 F (36.7 C)] 97 F (36.1 C) (09/28 0549) Pulse Rate:  [49-56] 50 (09/28 0549) Resp:  [16-20] 16 (09/28 0549) BP: (111-129)/(69-82) 129/82 (09/28 0549) SpO2:  [99 %-100 %] 99 % (09/28 0549) Weight:  [64.5 kg-67.5 kg] 67.5 kg (09/28 0549) Last BM Date : 01/18/24  Intake/Output from previous day: 09/27 0701 - 09/28 0700 In: 3873.8 [P.O.:2080; I.V.:1793.8] Out: 350 [Urine:350] Intake/Output this shift: No intake/output data recorded.  General nad Ab soft approp tender nondistended  Lab Results:  Recent Labs    01/19/24 0438 01/20/24 0413  WBC 16.9* 9.6  HGB 8.8* 8.3*  HCT 30.6* 29.8*  PLT 494* 485*   BMET Recent Labs    01/19/24 0438 01/20/24 0413  NA 137 136  K 5.0 4.6  CL 105 106  CO2 23 25  GLUCOSE 109* 89  BUN 5* 6  CREATININE 0.89 0.88  CALCIUM 8.6* 8.5*   PT/INR No results for input(s): LABPROT, INR in the last 72 hours. ABG No results for input(s): PHART, HCO3 in the last 72 hours.  Invalid input(s): PCO2, PO2  Studies/Results: No results found.  Anti-infectives: Anti-infectives (From admission, onward)    Start     Dose/Rate Route Frequency Ordered Stop   01/18/24 1400  neomycin  (MYCIFRADIN ) tablet 1,000 mg  Status:  Discontinued       Placed in And Linked Group   1,000 mg Oral 3 times per day 01/18/24 0744 01/18/24 0746   01/18/24 1400  metroNIDAZOLE  (FLAGYL ) tablet 1,000 mg  Status:  Discontinued       Placed in And Linked Group   1,000 mg Oral 3 times per day 01/18/24 0744 01/18/24 0746   01/18/24 0745  cefoTEtan  (CEFOTAN ) 2 g in sodium chloride  0.9 % 100 mL IVPB        2 g 200 mL/hr over 30 Minutes Intravenous On call to O.R. 01/18/24 0744 01/18/24 0947       Assessment/Plan: POD 2 lap right colon -regular diet -pulm toilet -not ready for dc  today hopefully tomorrow -sq heparin    Donnice Bury 01/20/2024

## 2024-01-20 NOTE — Plan of Care (Signed)

## 2024-01-21 ENCOUNTER — Ambulatory Visit: Payer: Self-pay | Admitting: Gastroenterology

## 2024-01-21 LAB — CBC
HCT: 30.8 % — ABNORMAL LOW (ref 39.0–52.0)
Hemoglobin: 9.1 g/dL — ABNORMAL LOW (ref 13.0–17.0)
MCH: 20.8 pg — ABNORMAL LOW (ref 26.0–34.0)
MCHC: 29.5 g/dL — ABNORMAL LOW (ref 30.0–36.0)
MCV: 70.3 fL — ABNORMAL LOW (ref 80.0–100.0)
Platelets: 514 K/uL — ABNORMAL HIGH (ref 150–400)
RBC: 4.38 MIL/uL (ref 4.22–5.81)
RDW: 18.7 % — ABNORMAL HIGH (ref 11.5–15.5)
WBC: 9 K/uL (ref 4.0–10.5)
nRBC: 0 % (ref 0.0–0.2)

## 2024-01-21 LAB — BASIC METABOLIC PANEL WITH GFR
Anion gap: 11 (ref 5–15)
BUN: 8 mg/dL (ref 6–20)
CO2: 24 mmol/L (ref 22–32)
Calcium: 9 mg/dL (ref 8.9–10.3)
Chloride: 101 mmol/L (ref 98–111)
Creatinine, Ser: 1 mg/dL (ref 0.61–1.24)
GFR, Estimated: >60 mL/min (ref >60)
Glucose, Bld: 82 mg/dL (ref 70–99)
Potassium: 4.4 mmol/L (ref 3.5–5.1)
Sodium: 135 mmol/L (ref 135–145)

## 2024-01-21 NOTE — Progress Notes (Signed)
  Subjective No acute events. Doing quite well. No n/v. Tolerating diet. Having flatus and Bms. Pain well controlled.  Objective: Vital signs in last 24 hours: Temp:  [98 F (36.7 C)-98.5 F (36.9 C)] 98.4 F (36.9 C) (09/29 0549) Pulse Rate:  [50-59] 50 (09/29 0549) Resp:  [16-18] 18 (09/29 0549) BP: (119-131)/(73-82) 119/75 (09/29 0549) SpO2:  [99 %-100 %] 100 % (09/29 0549) Last BM Date : 01/19/24  Intake/Output from previous day: 09/28 0701 - 09/29 0700 In: 1320 [P.O.:1320] Out: -  Intake/Output this shift: No intake/output data recorded.  Gen: NAD, comfortable CV: RRR Pulm: Normal work of breathing Abd: Soft, NT/ND; incisions c/d/I without erythema/drainage Ext: SCDs in place  Lab Results: CBC  Recent Labs    01/20/24 0413 01/21/24 0427  WBC 9.6 9.0  HGB 8.3* 9.1*  HCT 29.8* 30.8*  PLT 485* 514*   BMET Recent Labs    01/20/24 0413 01/21/24 0427  NA 136 135  K 4.6 4.4  CL 106 101  CO2 25 24  GLUCOSE 89 82  BUN 6 8  CREATININE 0.88 1.00  CALCIUM 8.5* 9.0   PT/INR No results for input(s): LABPROT, INR in the last 72 hours. ABG No results for input(s): PHART, HCO3 in the last 72 hours.  Invalid input(s): PCO2, PO2  Studies/Results:  Anti-infectives: Anti-infectives (From admission, onward)    Start     Dose/Rate Route Frequency Ordered Stop   01/18/24 1400  neomycin  (MYCIFRADIN ) tablet 1,000 mg  Status:  Discontinued       Placed in And Linked Group   1,000 mg Oral 3 times per day 01/18/24 0744 01/18/24 0746   01/18/24 1400  metroNIDAZOLE  (FLAGYL ) tablet 1,000 mg  Status:  Discontinued       Placed in And Linked Group   1,000 mg Oral 3 times per day 01/18/24 0744 01/18/24 0746   01/18/24 0745  cefoTEtan  (CEFOTAN ) 2 g in sodium chloride  0.9 % 100 mL IVPB        2 g 200 mL/hr over 30 Minutes Intravenous On call to O.R. 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   Hearing loss of left ear due to cerumen impaction 05/05/2016   Neck pain on right side 05/05/2016   Pre-syncope 10/14/2013   Urinary frequency 10/03/2013   Headache, chronic daily 10/03/2013   Irritable bowel syndrome 10/03/2013   Migraine headache 02/14/2013   Sinusitis, chronic 01/06/2013   Lateral epicondylitis of right elbow 03/06/2011   HYPERLIPIDEMIA 01/11/2010   DECREASED LIBIDO 01/11/2010   Nonallopathic lesion of lumbar region 12/21/2009   Backache 08/23/2009   s/p Procedure(s): Laparoscopic right hemicolectomy 01/18/2024  - Doing great - Motivated to go home today - Postoperative expectations reviewed, all questions answered, he expressed understanding and agreement with the plan.   LOS: 3 days    Lonni Pizza, MD [facility]

## 2024-01-21 NOTE — Progress Notes (Signed)
   01/21/24 0913  TOC Brief Assessment  Insurance and Status Reviewed  Patient has primary care physician No  Home environment has been reviewed resides in private residence  Prior level of function: Independent  Prior/Current Home Services No current home services  Social Drivers of Health Review SDOH reviewed no interventions necessary  Readmission risk has been reviewed Yes  Transition of care needs no transition of care needs at this time

## 2024-01-21 NOTE — Plan of Care (Signed)

## 2024-01-21 NOTE — Progress Notes (Signed)
 AVS reviewed w/ patient who verbalized an understanding. PIV removed as noted -AVS in [place] and [place], also given financial assistance forms in [place] and [place]. No other questions at this time. Pt dressing for d/c to home.

## 2024-01-22 NOTE — Discharge Summary (Signed)
 Patient ID: William Blankenship MRN: [id number] DOB/AGE: Sep 02, 1975 48 y.o.  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 No evident metastatic disease on visceral parietal peritoneum or liver. Laparoscopic right hemicolectomy carried out uneventfully.   Hospital Course: He was admitted postoperatively and recovered well.  On 01/21/2024, he is tolerating a diet, having regular bowel function, mobilizing well on his own.  Clinically doing quite well.  He is comfortable with and stable for discharge home.  Postoperative expectations have been reviewed and all questions answered.  He has expressed understanding.  Follow-up in our office has been arranged.    Allergies as of 01/21/2024   No Known Allergies      Medication List     TAKE these medications    acetaminophen  325 MG tablet Commonly known as: TYLENOL  Take 650 mg by mouth every 6 (six) hours as needed for moderate pain (pain score 4-6).   APPLE CIDER VINEGAR DIET PO Take 1 capsule by mouth daily.   azelastine  0.1 % nasal spray Commonly known as: ASTELIN  Place 1 spray into both nostrils 2 (two) times daily. Use in each nostril as directed   CALCIUM  500 PO Take 1 tablet by mouth daily.   CALCIUM MAGNESIUM ZINC PO Take 1 tablet by mouth daily.   Cetirizine  HCl 10 MG Caps Take 1 capsule (10 mg total) by mouth daily for 10 days.   Ensure Original Liqd Take 1 Bottle by mouth daily.   fluticasone  50 MCG/ACT nasal spray Commonly known as: FLONASE  Place 1-2 sprays into both nostrils daily for 7 days.   ibuprofen  600 MG tablet Commonly known as: ADVIL  Take 1 tablet (600 mg total) by mouth every 6 (six) hours as needed.   IRON  COMPLEX PO Take 1 tablet by mouth daily.   OVER THE COUNTER MEDICATION Take 1 Scoop by mouth daily. Elviation whey protein blend   oxyCODONE  5 MG immediate release tablet Commonly known as: Roxicodone  Take 1 tablet (5 mg total) by mouth every 6 (six) hours as needed for up to 5 days (postop pain not controlled with tylenol /ibuprofen  first). What changed:  when to take this reasons to take this          Follow-up Information     Teresa Lonni HERO, MD Follow up on 02/25/2024.   Specialties: General Surgery, Colon and Rectal Surgery Why: Please arrive by 1:10 pm Contact information: [address] [place] [address] [phone]                 Lonni HERO. Teresa, M.D. [facility]

## 2024-01-25 ENCOUNTER — Ambulatory Visit: Payer: Self-pay | Admitting: Surgery

## 2024-01-25 LAB — SURGICAL PATHOLOGY

## 2024-02-03 DIAGNOSIS — C182 Malignant neoplasm of ascending colon: Secondary | ICD-10-CM | POA: Insufficient documentation

## 2024-02-03 NOTE — Assessment & Plan Note (Signed)
 pT3N0M0 stage II, G3, dMMR with MSH6 loss  - Patient presents with worsening intermittent right sided abdominal pain, CT on December 24, 2023 showed large mass in the ascending colon with colonic and mesenteric lymphadenopathy, no distant metastasis. -Colonoscopy biopsy confirmed adenocarcinoma.  He underwent right hemicolectomy and lymph node dissection.  Surgical path showed poorly differentiated adenocarcinoma, T3, all 44 lymph nodes negative with negative margins. -Due to his young age and poorly differentiated adenocarcinoma, and MSH disease, I recommend adjuvant chemotherapy CapeOx or FOLFOX for 3 months. 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

## 2024-02-04 ENCOUNTER — Inpatient Hospital Stay (HOSPITAL_BASED_OUTPATIENT_CLINIC_OR_DEPARTMENT_OTHER): Payer: Self-pay | Admitting: Hematology

## 2024-02-04 ENCOUNTER — Inpatient Hospital Stay: Payer: Self-pay | Attending: Hematology

## 2024-02-04 VITALS — BP 118/68 | HR 67 | Temp 97.8°F | Resp 17 | Wt 137.3 lb

## 2024-02-04 DIAGNOSIS — Z79630 Long term (current) use of alkylating agent: Secondary | ICD-10-CM | POA: Insufficient documentation

## 2024-02-04 DIAGNOSIS — Z5111 Encounter for antineoplastic chemotherapy: Secondary | ICD-10-CM | POA: Insufficient documentation

## 2024-02-04 DIAGNOSIS — C182 Malignant neoplasm of ascending colon: Secondary | ICD-10-CM

## 2024-02-04 LAB — COMPREHENSIVE METABOLIC PANEL WITH GFR
ALT: 39 U/L (ref 0–44)
AST: 19 U/L (ref 15–41)
Albumin: 4.2 g/dL (ref 3.5–5.0)
Alkaline Phosphatase: 79 U/L (ref 38–126)
Anion gap: 6 (ref 5–15)
BUN: 20 mg/dL (ref 6–20)
CO2: 30 mmol/L (ref 22–32)
Calcium: 9.8 mg/dL (ref 8.9–10.3)
Chloride: 105 mmol/L (ref 98–111)
Creatinine, Ser: 1.03 mg/dL (ref 0.61–1.24)
GFR, Estimated: >60 mL/min (ref >60)
Glucose, Bld: 112 mg/dL — ABNORMAL HIGH (ref 70–99)
Potassium: 3.9 mmol/L (ref 3.5–5.1)
Sodium: 141 mmol/L (ref 135–145)
Total Bilirubin: 0.4 mg/dL (ref 0.0–1.2)
Total Protein: 7.5 g/dL (ref 6.5–8.1)

## 2024-02-04 LAB — FERRITIN: Ferritin: 21 ng/mL — ABNORMAL LOW (ref 24–336)

## 2024-02-04 LAB — CBC WITH DIFFERENTIAL/PLATELET
Abs Immature Granulocytes: 0.02 K/uL (ref 0.00–0.07)
Basophils Absolute: 0.1 K/uL (ref 0.0–0.1)
Basophils Relative: 1 %
Eosinophils Absolute: 0.2 K/uL (ref 0.0–0.5)
Eosinophils Relative: 2 %
HCT: 33 % — ABNORMAL LOW (ref 39.0–52.0)
Hemoglobin: 9.9 g/dL — ABNORMAL LOW (ref 13.0–17.0)
Immature Granulocytes: 0 %
Lymphocytes Relative: 31 %
Lymphs Abs: 2.4 K/uL (ref 0.7–4.0)
MCH: 21.6 pg — ABNORMAL LOW (ref 26.0–34.0)
MCHC: 30 g/dL (ref 30.0–36.0)
MCV: 72.1 fL — ABNORMAL LOW (ref 80.0–100.0)
Monocytes Absolute: 0.8 K/uL (ref 0.1–1.0)
Monocytes Relative: 10 %
Neutro Abs: 4.3 K/uL (ref 1.7–7.7)
Neutrophils Relative %: 56 %
Platelets: 469 K/uL — ABNORMAL HIGH (ref 150–400)
RBC: 4.58 MIL/uL (ref 4.22–5.81)
RDW: 23.5 % — ABNORMAL HIGH (ref 11.5–15.5)
WBC: 7.7 K/uL (ref 4.0–10.5)
nRBC: 0 % (ref 0.0–0.2)

## 2024-02-04 MED ORDER — CAPECITABINE 500 MG PO TABS: ORAL_TABLET | ORAL | 0 refills | Status: DC

## 2024-02-04 NOTE — Progress Notes (Signed)
 PATIENT NAVIGATOR PROGRESS NOTE  Name: William Blankenship Date: 02/04/2024 MRN: [id number]  DOB: [birth year]   Reason for visit:  Follow-up with Dr. Onita Mattock  Comments:   Patient was seen in office during his follow-up visit with Dr. Mattock to discuss treatment recommendations.  Plan is for patient to start adjuvant chemo the week of 10/27.  Port-a-cath was discussed with patient, but patient has opted to forgo port placement at this time.  Patient inquired about financial assistance.  Message sent to Social Work to follow-up with patient.  Referral entered for Nutrition.     Time spent counseling/coordinating care: 45-60 minutes

## 2024-02-04 NOTE — Progress Notes (Signed)
 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 -Colonoscopy biopsy confirmed adenocarcinoma.  He underwent right hemicolectomy and lymph node dissection.  Surgical path showed poorly differentiated adenocarcinoma, T3, all 44 lymph nodes negative with negative margins. -Due to his young age and poorly differentiated adenocarcinoma, and MSH disease, I recommend adjuvant chemotherapy CapeOx or FOLFOX for 3 months. 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  Assessment & Plan Stage II poorly differentiated colon cancer, post-resection, planned adjuvant chemotherapy Stage II colon cancer with poorly differentiated adenocarcinoma, indicating higher recurrence risk. Tumor was large (10 cm) but had not deeply penetrated the colon wall. Surgical resection was successful with no cancer in 44 lymph nodes, suggesting reactive lymphadenopathy. Adjuvant chemotherapy recommended due to poorly differentiated tumor and young age. - Initiate adjuvant chemotherapy with CAPOX (capecitabine and oxaliplatin) regimen, consisting of oral and intravenous drugs, for four treatments over three months. - Schedule chemotherapy to start the week of February 18, 2024. - Administer intravenous chemotherapy without a port, as he prefers IV in the arm. - Call in oral chemotherapy prescription to [facility] for insurance verification and delivery. - Arrange for chemotherapy class to educate on  treatment expectations and side effect management. - Prescribe two anti-nausea medications as needed. - Provide a letter for his employer regarding work limitations during chemotherapy. - Schedule surveillance with CT scans every 6-12 months and lab tests every 3-4 months post-chemotherapy. - Plan for colonoscopy one year post-surgery.  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 No known family history due to lack of information about father's side. - Refer to genetic counseling for further evaluation and blood testing for Lynch syndrome. - If positive for Lynch syndrome, recommend genetic testing for his three children and five brothers. - Discuss implications of Lynch syndrome, including need for early colonoscopy screening for family members if they test positive.  Financial and insurance considerations Concerned about treatment cost due to lack of insurance. Financial assistance and insurance options are being explored to alleviate financial burden of chemotherapy and associated medications. - Coordinate with social worker Devere Primmer for financial assistance and insurance application. - Ensure communication with him regarding any financial assistance or insurance coverage updates.  Plan - He is recovering well from surgery. - I recommend adjuvant chemotherapy CapeOx for 4 cycles - Plan to start chemotherapy in 2 weeks - Genetic referral - Follow-up before for cycle chemo - I encouraged him to call our social worker Devere to review financial assistance and lack of insurance issue -no port placement    SUMMARY OF ONCOLOGIC HISTORY: Oncology History  Cancer of right colon (HCC)  01/18/2024 Cancer Staging   Staging form: Colon and Rectum, AJCC 8th Edition - Pathologic stage from 01/18/2024: Stage IIA (pT3, pN0, cM0) - Signed by Lanny Callander, MD on 02/03/2024 Total positive nodes: 0 Histologic grading system: 4 grade  system Histologic grade (G): G3 Residual tumor (R): R0   02/03/2024 Initial Diagnosis   Cancer of right colon (HCC)   02/18/2024 -  Chemotherapy   Patient is on Treatment Plan : GASTRIC CapeOx (1000/130) q21d x 8 Cycles         Discussed the use of AI scribe software for clinical note transcription with the patient, who gave verbal consent to proceed.  History of Present Illness William Blankenship is a 48 year old male with stage two colon cancer who presents for a follow-up visit post-surgery. He is accompanied by his son.  He underwent surgery for stage two colon cancer, with removal of a 10 cm tumor and surrounding lymph nodes. Post-surgery, he experiences pain at the incision site after excessive walking but does not require pain medication. He also has discomfort when urinating.  The surgical pathology report showed all 44 lymph nodes were negative for cancer. The cancer was poorly differentiated adenocarcinoma.  There is a potential concern for Lynch syndrome due to the loss of one of the MMR proteins. He has no known family history of colon, stomach, or endometrial cancer, as he lacks information about his paternal family history. His mother has not reported any family history of colon cancer. He has three children and five brothers who may need genetic testing if he tests positive for Lynch syndrome.     All other systems were reviewed with the patient and are negative.  MEDICAL HISTORY:  Past Medical History:  Diagnosis Date   Abrasion of anterior lower leg 07/29/2012   Anemia    Back pain    Colonic mass    GERD (gastroesophageal reflux disease)    Hand injury 03/31/2013   Hyperlipidemia    Migraines    PATELLAR DISLOCATION, LEFT 01/31/2010   Qualifier: Diagnosis of  By: Rosalynn MD, Camie      SURGICAL HISTORY: Past Surgical History:  Procedure Laterality Date   conoscopy       LAPAROSCOPIC ILEOCECECTOMY Right 01/18/2024   Procedure: EXCISION, CECUM WITH ILEUM, LAPAROSCOPIC;  Surgeon: Teresa Lonni HERO, MD;  Location: [facility];  Service: General;  Laterality: Right;  LAPAROSCOPIC RIGHT HEMICOLECTOMY    I have reviewed the social history and family history with the patient and they are unchanged from previous note.  ALLERGIES:  has no known allergies.  MEDICATIONS:  Current Outpatient Medications  Medication Sig Dispense Refill   acetaminophen  (TYLENOL ) 325 MG tablet Take 650 mg by mouth every 6 (six) hours as needed for moderate pain (pain score 4-6).     azelastine  (ASTELIN ) 0.1 % nasal spray Place 1 spray into both nostrils 2 (two) times daily. Use in each nostril as directed 30 mL 1   Calcium Carbonate (CALCIUM 500 PO) Take 1 tablet by mouth daily.     CALCIUM MAGNESIUM ZINC PO Take 1 tablet by mouth daily.     ibuprofen  (ADVIL ) 600 MG tablet Take 1 tablet (600 mg total) by mouth every 6 (six) hours as needed. 30 tablet 0   Iron  Combinations (IRON  COMPLEX PO) Take 1 tablet by mouth daily.     Misc Natural Products (APPLE CIDER VINEGAR DIET PO) Take 1 capsule by mouth daily.     Nutritional Supplements (ENSURE ORIGINAL) LIQD Take 1 Bottle by mouth daily.     OVER THE COUNTER MEDICATION Take 1 Scoop by mouth daily. Elviation whey protein blend     Cetirizine  HCl 10 MG CAPS Take 1 capsule (10 mg total) by mouth daily  for 10 days. (Patient not taking: No sig reported) 10 capsule 0   fluticasone  (FLONASE ) 50 MCG/ACT nasal spray Place 1-2 sprays into both nostrils daily for 7 days. (Patient not taking: No sig reported) 1 g 0   No current facility-administered medications for this visit.    PHYSICAL EXAMINATION: ECOG PERFORMANCE STATUS: 1 - Symptomatic but completely ambulatory  Vitals:   02/04/24 1313  BP: 118/68  Pulse: 67  Resp: 17  Temp: 97.8 F (36.6 C)  SpO2: 99%   Wt Readings from Last 3 Encounters:  02/04/24 137 lb 4.8 oz (62.3 kg)  01/20/24 148  lb 12.8 oz (67.5 kg)  01/17/24 134 lb (60.8 kg)     GENERAL:alert, no distress and comfortable SKIN: skin color, texture, turgor are normal, no rashes or significant lesions EYES: normal, Conjunctiva are pink and non-injected, sclera clear NECK: supple, thyroid normal size, non-tender, without nodularity LYMPH:  no palpable lymphadenopathy in the cervical, axillary  LUNGS: clear to auscultation and percussion with normal breathing effort HEART: regular rate & rhythm and no murmurs and no lower extremity edema ABDOMEN:abdomen soft, non-tender and normal bowel sounds, midline abdominal incision has healed well. Musculoskeletal:no cyanosis of digits and no clubbing  NEURO: alert & oriented x 3 with fluent speech, no focal motor/sensory deficits  Physical Exam    LABORATORY DATA:  I have reviewed the data as listed    Latest Ref Rng & Units 02/04/2024   12:49 PM 01/21/2024    4:27 AM 01/20/2024    4:13 AM  CBC  WBC 4.0 - 10.5 K/uL 7.7  9.0  9.6   Hemoglobin 13.0 - 17.0 g/dL 9.9  9.1  8.3   Hematocrit 39.0 - 52.0 % 33.0  30.8  29.8   Platelets 150 - 400 K/uL 469  514  485         Latest Ref Rng & Units 02/04/2024   12:49 PM 01/21/2024    4:27 AM 01/20/2024    4:13 AM  CMP  Glucose 70 - 99 mg/dL 887  82  89   BUN 6 - 20 mg/dL 20  8  6    Creatinine 0.61 - 1.24 mg/dL 8.96  8.99  9.11   Sodium 135 - 145 mmol/L 141  135  136   Potassium 3.5 - 5.1 mmol/L 3.9  4.4  4.6   Chloride 98 - 111 mmol/L 105  101  106   CO2 22 - 32 mmol/L 30  24  25    Calcium 8.9 - 10.3 mg/dL 9.8  9.0  8.5   Total Protein 6.5 - 8.1 g/dL 7.5     Total Bilirubin 0.0 - 1.2 mg/dL 0.4     Alkaline Phos 38 - 126 U/L 79     AST 15 - 41 U/L 19     ALT 0 - 44 U/L 39         RADIOGRAPHIC STUDIES: I have personally reviewed the radiological images as listed and agreed with the findings in the report. No results found.    Orders Placed This Encounter  Procedures   CBC with Differential/Platelet     Standing Status:   Standing    Number of Occurrences:   50    Expiration Date:   02/02/2025   Comprehensive metabolic panel with GFR    Standing Status:   Standing    Number of Occurrences:   50    Expiration Date:   02/02/2025   CEA (Access)    Standing  Status:   Standing    Number of Occurrences:   30    Expiration Date:   02/02/2025   Ferritin    Standing Status:   Standing    Number of Occurrences:   20    Expiration Date:   02/02/2025   Ambulatory referral to Genetics    Referral Priority:   Routine    Referral Type:   Consultation    Referral Reason:   Specialty Services Required    Number of Visits Requested:   1   All questions were answered. The patient knows to call the clinic with any problems, questions or concerns. No barriers to learning was detected. The total time spent in the appointment was 60 minutes, including review of chart and various tests results, discussions about plan of care and coordination of care plan     Onita Mattock, MD 02/04/2024

## 2024-02-04 NOTE — Progress Notes (Signed)
 START OFF PATHWAY REGIMEN - Colorectal   [id number]:Capecitabine 1,000 mg/m2 PO BID D1-14 + Oxaliplatin 130 mg/m2 IV D1 q21 Days:   A cycle is every 21 days:     Capecitabine      Oxaliplatin   **Always confirm dose/schedule in your pharmacy ordering system**  Patient Characteristics: Postoperative without Neoadjuvant Therapy, M0 (Pathologic Staging), Colon, Stage II, MSI-H/dMMR, Stage IIA Tumor Location: Colon Therapeutic Status: Postoperative without Neoadjuvant Therapy, M0 (Pathologic Staging) AJCC M Category: cM0 AJCC T Category: pT3 AJCC N Category: pN0 AJCC 8 Stage Grouping: IIA Microsatellite/Mismatch Repair Status: MSI-H/dMMR Intent of Therapy: Curative Intent, Discussed with Patient

## 2024-02-05 ENCOUNTER — Telehealth: Payer: Self-pay

## 2024-02-05 ENCOUNTER — Other Ambulatory Visit (HOSPITAL_COMMUNITY): Payer: Self-pay

## 2024-02-05 ENCOUNTER — Telehealth: Payer: Self-pay | Admitting: Hematology

## 2024-02-05 ENCOUNTER — Other Ambulatory Visit: Payer: Self-pay

## 2024-02-05 ENCOUNTER — Telehealth: Payer: Self-pay | Admitting: Pharmacist

## 2024-02-05 DIAGNOSIS — C182 Malignant neoplasm of ascending colon: Secondary | ICD-10-CM

## 2024-02-05 LAB — CEA (ACCESS): CEA (CHCC): <1 ng/mL (ref 0.00–5.00)

## 2024-02-05 NOTE — Telephone Encounter (Deleted)
 SABRA

## 2024-02-05 NOTE — Telephone Encounter (Signed)
 [id number] interpretor ID completed the call informing William Blankenship of his 1st treatment appts scheduled for 10/28. He is aware of all appointment details.

## 2024-02-05 NOTE — Telephone Encounter (Signed)
 Oral Oncology Patient Advocate Encounter  After completing a benefits investigation, prior authorization for Capecitabine is not required at this time, patient does not have insurance coverage at this time.   [facility] Cash Price is #84 Tablets for $29.40     Charlott Hamilton,  CPhT-Adv  she/her/hers [facility]  [facility] Specialty Pharmacy Services Pharmacy Technician Patient Advocate Specialist III [facility] Phone: [phone]  Fax: [phone] [email or web address]

## 2024-02-05 NOTE — Telephone Encounter (Addendum)
 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  CBC w/ Diff and CMP from 10/13 assessed, no relevant lab abnormalities requiring baseline dose adjustment required at this time.  Patient will take Xeloda 500mg , 3 tablets (1500 mg total) twice daily for 14 days then off for 7 days. Start on the same day as IV Oxaliplatin (02/19/24).   Current medication list in Epic reviewed, no DDIs with Xeloda identified.  Evaluated chart and no patient barriers to medication adherence noted.   Patient agreement for treatment documented in MD note on 02/04/24.  Prescription has been e-scribed to the [facility] for benefits analysis and approval.  Oral Oncology Clinic will continue to follow for insurance authorization, copayment issues, initial counseling and start date.  Dionicia Canavan, PharmD, RPh PGY1 Acute Care Pharmacy Resident [facility]  02/05/2024 8:06 AM  Oral Oncology Clinic [phone]

## 2024-02-06 ENCOUNTER — Telehealth: Payer: Self-pay | Admitting: Genetic Counselor

## 2024-02-06 ENCOUNTER — Other Ambulatory Visit: Payer: Self-pay

## 2024-02-06 MED ORDER — CAPECITABINE 500 MG PO TABS
ORAL_TABLET | ORAL | 0 refills | Status: DC
  Filled 2024-02-08: qty 84, 21d supply, fill #0

## 2024-02-06 NOTE — Telephone Encounter (Signed)
 Oral Oncology Patient Advocate Encounter  Information provided by interpreter Mliss:  Patient will pay cash price and will pick from [facility] Monday 02/11/24

## 2024-02-06 NOTE — Progress Notes (Signed)
 The proposed treatment discussed in conference is for discussion purpose only and is not a binding recommendation.  The patients have not been physically examined, or presented with their treatment options.  Therefore, final treatment plans cannot be decided.

## 2024-02-06 NOTE — Telephone Encounter (Signed)
 Messaged courtney and Jessica to try to get this patient in for testing.  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  He is Spanish speaking.  Darice Monte, MS, CGC  Licensed, Patent attorney [email or web address] phone: [phone]

## 2024-02-07 ENCOUNTER — Other Ambulatory Visit: Payer: Self-pay

## 2024-02-07 ENCOUNTER — Other Ambulatory Visit: Payer: Self-pay | Admitting: Hematology

## 2024-02-07 ENCOUNTER — Encounter (HOSPITAL_COMMUNITY): Payer: Self-pay | Admitting: Unknown Physician Specialty

## 2024-02-07 MED ORDER — ONDANSETRON HCL 8 MG PO TABS
8.0000 mg | ORAL_TABLET | Freq: Three times a day (TID) | ORAL | 2 refills | Status: DC | PRN
Start: 2024-02-07 — End: 2024-02-12

## 2024-02-07 MED ORDER — PROCHLORPERAZINE MALEATE 10 MG PO TABS: 10.0000 mg | ORAL_TABLET | Freq: Four times a day (QID) | ORAL | 2 refills | Status: DC | PRN

## 2024-02-08 ENCOUNTER — Inpatient Hospital Stay: Payer: Self-pay

## 2024-02-08 ENCOUNTER — Other Ambulatory Visit: Payer: Self-pay

## 2024-02-08 NOTE — Progress Notes (Signed)
 Specialty Pharmacy Initiation Note   William Blankenship is a 48 y.o. male who will be followed by the specialty pharmacy service for RxSp Oncology    Review of administration, indication, effectiveness, safety, potential side effects, storage/disposable, and missed dose instructions occurred today for patient's specialty medication(s) Xeloda (capecitabine)  Patient/Caregiver did not have any additional questions or concerns.   Patient's therapy is appropriate to: Initiate    Goals Addressed             This Visit's Progress    Achieve a cure       Patient is initiating therapy. Patient will maintain adherence         [name], PharmD, BCPS, BCOP Hematology/Oncology Clinical Pharmacist [phone] 02/08/2024 9:08 AM

## 2024-02-08 NOTE — Progress Notes (Signed)
 Specialty Pharmacy Initial Fill Coordination Note  William Blankenship is a 48 y.o. male contacted today regarding refills of specialty medication(s) Capecitabine (XELODA) .  Patient requested [name] at [facility] at [place]  on 02/11/24   Medication will be filled on 02/08/24.   Patient is aware of $29.40 cash price at [facility].

## 2024-02-08 NOTE — Telephone Encounter (Signed)
 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   Treatment goal: Curative  Counseled patient on administration, dosing, side effects, monitoring, drug-food interactions, safe handling, storage, and disposal.  Patient will take capecitabine 500mg  tablets - 3 tablets by mouth two times daily after meals. Take for 14 days, then off for 7 days. Repeat every 21 days. Start on same day of IV chemotherapy.  Start date: 02/19/24  Side effects include but not limited to: diarrhea, hand-and-foot syndrome, nausea and vomiting, decreased blood counts, fatigue Diarrhea: Patient knows to take Imodium (loperamide) if they experience diarrhea. Patient knows to alert the office of 4 or more loose stools above baseline. Hand-and-foot syndrome: Patient will apply Voltaren gel and Udderly smooth cream to hands and feet twice daily Nausea/Vomiting: Patient will pick up prescriptions for Zofran  and Compazine and knows to take these medications if he experiences significant nausea.    Reviewed with patient importance of keeping a medication schedule and plan for any missed doses. Provided patient with calendar.  After discussion with patient no patient barriers to medication adherence identified.   Distress thermometer flowsheet: Distress thermometer completed during in person visit and reviewed with patient. Due to score, social work referral has not been sent.   Communication and Learning Assessment Primary learner: Patient and patient's son Barriers to learning: No barriers Preferred language: English Learning preferences: Listening Reading  Patient and patient's son voiced understanding and appreciation. All questions answered. Medication  handout provided.  Provided patient with Oral Chemotherapy Navigation Clinic phone number. Patient knows to call the office with questions or concerns.  Dionicia Canavan, PharmD, RPh PGY1 Acute Care Pharmacy Resident [facility]  02/08/2024 4:17 PM

## 2024-02-11 ENCOUNTER — Encounter: Payer: Self-pay | Admitting: Hematology

## 2024-02-11 NOTE — Progress Notes (Signed)
 Called interpreter(Julie) to reach out to patient to follow up on applying for [employer] grant now that he is in treatment. She will advise him what is needed to apply and to bring on 10/28.25.

## 2024-02-12 ENCOUNTER — Other Ambulatory Visit (HOSPITAL_COMMUNITY): Payer: Self-pay

## 2024-02-12 ENCOUNTER — Encounter: Payer: Self-pay | Admitting: Hematology

## 2024-02-12 ENCOUNTER — Other Ambulatory Visit: Payer: Self-pay

## 2024-02-12 MED ORDER — ONDANSETRON HCL 8 MG PO TABS
8.0000 mg | ORAL_TABLET | Freq: Three times a day (TID) | ORAL | 2 refills | Status: DC | PRN
  Filled 2024-02-12: qty 20, 7d supply, fill #0
  Filled 2024-03-06: qty 20, 7d supply, fill #1

## 2024-02-12 MED ORDER — OXYCODONE HCL 5 MG PO TABS
5.0000 mg | ORAL_TABLET | Freq: Four times a day (QID) | ORAL | 0 refills | Status: DC | PRN
  Filled 2024-02-12: qty 20, 5d supply, fill #0

## 2024-02-12 MED ORDER — PROCHLORPERAZINE MALEATE 10 MG PO TABS
10.0000 mg | ORAL_TABLET | Freq: Four times a day (QID) | ORAL | 2 refills | Status: AC | PRN
  Filled 2024-02-12: qty 30, 8d supply, fill #0
  Filled 2024-03-06: qty 30, 8d supply, fill #1
  Filled 2024-04-21 (×2): qty 30, 8d supply, fill #2

## 2024-02-13 ENCOUNTER — Other Ambulatory Visit (HOSPITAL_COMMUNITY): Payer: Self-pay

## 2024-02-13 ENCOUNTER — Encounter: Payer: Self-pay | Admitting: Hematology

## 2024-02-13 NOTE — Progress Notes (Signed)
 Pharmacist Chemotherapy Monitoring - Initial Assessment    Anticipated start date: 02/19/24   The following has been reviewed per standard work regarding the patient's treatment regimen: The patient's diagnosis, treatment plan and drug doses, and organ/hematologic function Lab orders and baseline tests specific to treatment regimen  The treatment plan start date, drug sequencing, and pre-medications Prior authorization status  Patient's documented medication list, including drug-drug interaction screen and prescriptions for anti-emetics and supportive care specific to the treatment regimen The drug concentrations, fluid compatibility, administration routes, and timing of the medications to be used The patient's access for treatment and lifetime cumulative dose history, if applicable  The patient's medication allergies and previous infusion related reactions, if applicable   Changes made to treatment plan:  treatment plan date  Follow up needed:  N/A  Harlene JONELLE Nasuti, RPH, 02/13/2024  10:42 AM

## 2024-02-14 ENCOUNTER — Other Ambulatory Visit: Payer: Self-pay

## 2024-02-19 ENCOUNTER — Other Ambulatory Visit: Payer: Self-pay

## 2024-02-19 ENCOUNTER — Inpatient Hospital Stay: Payer: Self-pay | Admitting: Nutrition

## 2024-02-19 ENCOUNTER — Inpatient Hospital Stay: Payer: Self-pay

## 2024-02-19 ENCOUNTER — Encounter: Payer: Self-pay | Admitting: Hematology

## 2024-02-19 ENCOUNTER — Inpatient Hospital Stay (HOSPITAL_BASED_OUTPATIENT_CLINIC_OR_DEPARTMENT_OTHER): Payer: Self-pay | Admitting: Hematology

## 2024-02-19 VITALS — BP 118/68 | HR 66 | Temp 98.7°F | Resp 15 | Ht 65.0 in | Wt 146.4 lb

## 2024-02-19 VITALS — BP 130/85 | HR 65 | Temp 98.0°F | Resp 18

## 2024-02-19 DIAGNOSIS — C182 Malignant neoplasm of ascending colon: Secondary | ICD-10-CM

## 2024-02-19 LAB — CMP (CANCER CENTER ONLY)
ALT: 45 U/L — ABNORMAL HIGH (ref 0–44)
AST: 18 U/L (ref 15–41)
Albumin: 4.2 g/dL (ref 3.5–5.0)
Alkaline Phosphatase: 78 U/L (ref 38–126)
Anion gap: 5 (ref 5–15)
BUN: 17 mg/dL (ref 6–20)
CO2: 27 mmol/L (ref 22–32)
Calcium: 9.3 mg/dL (ref 8.9–10.3)
Chloride: 108 mmol/L (ref 98–111)
Creatinine: 0.85 mg/dL (ref 0.61–1.24)
GFR, Estimated: >60 mL/min (ref >60)
Glucose, Bld: 104 mg/dL — ABNORMAL HIGH (ref 70–99)
Potassium: 3.6 mmol/L (ref 3.5–5.1)
Sodium: 140 mmol/L (ref 135–145)
Total Bilirubin: 0.3 mg/dL (ref 0.0–1.2)
Total Protein: 7.4 g/dL (ref 6.5–8.1)

## 2024-02-19 LAB — CBC WITH DIFFERENTIAL (CANCER CENTER ONLY)
Abs Immature Granulocytes: 0.02 K/uL (ref 0.00–0.07)
Basophils Absolute: 0 K/uL (ref 0.0–0.1)
Basophils Relative: 0 %
Eosinophils Absolute: 0.2 K/uL (ref 0.0–0.5)
Eosinophils Relative: 3 %
HCT: 33.6 % — ABNORMAL LOW (ref 39.0–52.0)
Hemoglobin: 10.5 g/dL — ABNORMAL LOW (ref 13.0–17.0)
Immature Granulocytes: 0 %
Lymphocytes Relative: 29 %
Lymphs Abs: 2.2 K/uL (ref 0.7–4.0)
MCH: 23 pg — ABNORMAL LOW (ref 26.0–34.0)
MCHC: 31.3 g/dL (ref 30.0–36.0)
MCV: 73.5 fL — ABNORMAL LOW (ref 80.0–100.0)
Monocytes Absolute: 0.9 K/uL (ref 0.1–1.0)
Monocytes Relative: 11 %
Neutro Abs: 4.2 K/uL (ref 1.7–7.7)
Neutrophils Relative %: 57 %
Platelet Count: 383 K/uL (ref 150–400)
RBC: 4.57 MIL/uL (ref 4.22–5.81)
RDW: 23.6 % — ABNORMAL HIGH (ref 11.5–15.5)
WBC Count: 7.6 K/uL (ref 4.0–10.5)
nRBC: 0 % (ref 0.0–0.2)

## 2024-02-19 MED ORDER — DEXTROSE 5 % IV SOLN: INTRAVENOUS | Status: DC

## 2024-02-19 MED ORDER — DEXAMETHASONE SOD PHOSPHATE PF 10 MG/ML IJ SOLN
10.0000 mg | Freq: Once | INTRAMUSCULAR | Status: AC
  Administered 2024-02-19: 10 mg via INTRAVENOUS

## 2024-02-19 MED ORDER — PALONOSETRON HCL INJECTION 0.25 MG/5ML
0.2500 mg | Freq: Once | INTRAVENOUS | Status: AC
  Administered 2024-02-19: 0.25 mg via INTRAVENOUS
  Filled 2024-02-19: qty 5

## 2024-02-19 MED ORDER — OXALIPLATIN CHEMO INJECTION 100 MG/20ML
130.0000 mg/m2 | Freq: Once | INTRAVENOUS | Status: AC
  Administered 2024-02-19: 200 mg via INTRAVENOUS
  Filled 2024-02-19: qty 40

## 2024-02-19 NOTE — Progress Notes (Signed)
 48 year old male diagnosed with colon cancer status post right hemicolectomy.  He is followed by Dr. Ileana.  Plan includes 4 cycles of CapeOx.  Past medical history includes anemia, GERD, hyperlipidemia.  Medications include calcium, calcium magnesium and zinc, Xeloda, iron , Zofran , and Compazine.  Labs include glucose 104 on October 28.  Height: 5 feet 5 inches. Weight: 146 pounds 6.4 ounces. Usual body weight: 160 pounds per patient. BMI: 24.36.  Patient reports that he loves to eat and did not feel good when he weighed to 160 pounds.  States he got short of breath going up stairs.  Reports he has healed well from his surgery.  He still not has not introduced a lot of gas-forming foods.  He has another appointment with his surgeon next week.  Patient had some questions surrounding food safety and diet advancement.  Nutrition diagnosis: Food and nutrition related knowledge deficit related to colon cancer and associated treatments as evidenced by no prior need for nutrition related information.  Intervention: Educated to consume smaller more frequent meals and snacks with high-protein foods. When ready to advance diet, slowly introduce higher-fiber, gas-forming foods in small portions 1 at a time. Strive for weight maintenance. Reviewed food safety. Encouraged adequate fluids. Questions answered and contact information given. Nutrition in the person with cancer booklet was given in Spanish. Contact information given.  Monitoring, evaluation, goals: Patient will tolerate adequate calories and protein and minimize loss of lean body mass throughout treatment.  Next visit: To be scheduled with treatment as needed.  **Disclaimer: This note was dictated with voice recognition software. Similar sounding words can inadvertently be transcribed and this note may contain transcription errors which may not have been corrected upon publication of note.**

## 2024-02-19 NOTE — Progress Notes (Signed)
 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 -Colonoscopy biopsy confirmed adenocarcinoma.  He underwent right hemicolectomy and lymph node dissection.  Surgical path showed poorly differentiated adenocarcinoma, T3, all 44 lymph nodes negative with negative margins. -Due to his young age and poorly differentiated adenocarcinoma, and MSH disease, I recommend adjuvant chemotherapy CapeOx or FOLFOX for 3 months. 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  Assessment & Plan Malignant neoplasm of ascending colon, status post resection, on adjuvant chemotherapy Currently undergoing adjuvant chemotherapy following resection of ascending colon cancer. Starting chemotherapy today with a regimen that includes oral chemotherapy pills. Discussed potential side effects of chemotherapy, including nausea, stomach upset, taste changes, loss of appetite, and cold sensitivity. Advised that symptoms may worsen by Thursday or Friday and improve by next week. Emphasized the importance of monitoring for severe side effects such as severe diarrhea, inability to eat or drink, dizziness, or syncope, and to contact the provider immediately if these occur. - Start chemotherapy regimen: oral pills, 3 tablets in the morning and 3 in the evening for 14 days, followed by 7 days off - Educate on side effects of chemotherapy and management strategies - Instruct to avoid cold foods and drinks during  chemotherapy - Plan to call next week to assess tolerance to chemotherapy - Schedule follow-up in three weeks for the next chemotherapy cycle - Refer to dietitian for nutritional support  Postoperative pain following colon cancer surgery Experiencing postoperative pain, particularly when moving or applying pressure to the surgical site. Pain is present at the incision site and occasionally radiates to other areas. The incision is healing, with some residual glue present.  Syncope with recurrent episodes Recurrent episodes of syncope, most recently occurring before lunch, possibly related to hypotension, dehydration, or hypoglycemia. Differential diagnosis includes vasovagal syncope, arrhythmia, or seizure, though seizure is less likely. Previous episodes associated with physical exertion and heat exposure, suggesting dehydration as a potential cause. No recent blood pressure measurements available. Previous EKG in September was normal. Discussed the importance of monitoring blood pressure and blood sugar during episodes. - Perform EKG to rule out cardiac causes - Check orthostatic blood pressure - Advise to monitor blood pressure at home and drink fluids if low - Instruct to drink orange juice or sports drinks if feeling dizzy or lightheaded - Refer to cardiologist for further evaluation of potential arrhythmia - Consult with social worker regarding insurance options for cardiology referral  Plan - Due to his recurrent episodes of syncope, I did the EKG in the clinic which was unremarkable, orthostatic vital signs were normal. - Lab reviewed, adequate for treatment, will proceed for cycle CapeOx today - Phone visit next week for toxicity check - He will retain clinically in 3 weeks before next cycle chemo   SUMMARY OF ONCOLOGIC HISTORY: Oncology History  Cancer of right colon (HCC)  01/18/2024 Cancer Staging   Staging form: Colon and Rectum, AJCC 8th Edition - Pathologic stage from  01/18/2024: Stage IIA (pT3, pN0,  cM0) - Signed by Lanny Callander, MD on 02/03/2024 Total positive nodes: 0 Histologic grading system: 4 grade system Histologic grade (G): G3 Residual tumor (R): R0   02/03/2024 Initial Diagnosis   Cancer of right colon (HCC)   02/19/2024 -  Chemotherapy   Patient is on Treatment Plan : GASTRIC CapeOx (1000/130) q21d x 8 Cycles         Discussed the use of AI scribe software for clinical note transcription with the patient, who gave verbal consent to proceed.  History of Present Illness William Blankenship is a 48 year old male with colon cancer who presents for follow-up after experiencing syncope. He is accompanied by his wife and son.  He experienced a syncopal episode at home while standing, with no preceding dizziness or warning signs. He was unconscious for about 30 seconds and took approximately three minutes to fully regain awareness. He experienced cold sweating but no fever or confusion post-episode. He nearly passed out twice more on the same day without losing consciousness.  He recalls a similar sensation of nearly passing out two to three days ago. He has experienced syncope approximately five times over the past four to five years, often associated with working in heat and sweating excessively. He has fallen from a ladder twice during these episodes.  On the day of the episode, he consumed a protein shake and some cookies for breakfast. He has not checked his blood pressure at home recently.  He is currently undergoing chemotherapy for colon cancer and is starting a new cycle today. 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     All other systems were reviewed with the patient and are negative.  MEDICAL HISTORY:  Past Medical History:  Diagnosis Date   Abrasion of anterior lower leg 07/29/2012   Anemia    Back pain    Colonic mass    GERD (gastroesophageal reflux disease)    Hand injury  03/31/2013   Hyperlipidemia    Migraines    PATELLAR DISLOCATION, LEFT 01/31/2010   Qualifier: Diagnosis of  By: Rosalynn MD, Camie      SURGICAL HISTORY: Past Surgical History:  Procedure Laterality Date   conoscopy      LAPAROSCOPIC ILEOCECECTOMY Right 01/18/2024   Procedure: EXCISION, CECUM WITH ILEUM, LAPAROSCOPIC;  Surgeon: Teresa Lonni HERO, MD;  Location: [facility];  Service: General;  Laterality: Right;  LAPAROSCOPIC RIGHT HEMICOLECTOMY    I have reviewed the social history and family history with the patient and they are unchanged from previous note.  ALLERGIES:  has no known allergies.  MEDICATIONS:  Current Outpatient Medications  Medication Sig Dispense Refill   acetaminophen  (TYLENOL ) 325 MG tablet Take 650 mg by mouth every 6 (six) hours as needed for moderate pain (pain score 4-6).     azelastine  (ASTELIN ) 0.1 % nasal spray Place 1 spray into both nostrils 2 (two) times daily. Use in each nostril as directed 30 mL 1   Calcium Carbonate (CALCIUM 500 PO) Take 1 tablet by mouth daily. (Patient not taking: Reported on 02/08/2024)     CALCIUM MAGNESIUM ZINC PO Take 1 tablet by mouth daily. (Patient not taking: Reported on 02/08/2024)     capecitabine (XELODA) 500 MG tablet Take 3 tablets (1500 mg total) by mouth 2 (two) times daily after meals. Take 30 minutes after meal. Take for 14 days, then off for 7 days. Repeat every 21 days. Start on same day of IV chemo 84 tablet  0   Cetirizine  HCl 10 MG CAPS Take 1 capsule (10 mg total) by mouth daily for 10 days. (Patient not taking: Reported on 02/08/2024) 10 capsule 0   fluticasone  (FLONASE ) 50 MCG/ACT nasal spray Place 1-2 sprays into both nostrils daily for 7 days. (Patient not taking: No sig reported) 1 g 0   ibuprofen  (ADVIL ) 600 MG tablet Take 1 tablet (600 mg total) by mouth every 6 (six) hours as needed. (Patient not taking: Reported on 02/08/2024) 30 tablet 0   Iron  Combinations (IRON  COMPLEX PO) Take 1 tablet by mouth daily.      Misc Natural Products (APPLE CIDER VINEGAR DIET PO) Take 1 capsule by mouth daily. (Patient not taking: Reported on 02/08/2024)     Nutritional Supplements (ENSURE ORIGINAL) LIQD Take 1 Bottle by mouth daily.     ondansetron  (ZOFRAN ) 8 MG tablet Take 1 tablet (8 mg total) by mouth every 8 (eight) hours as needed for nausea or vomiting. 20 tablet 2   OVER THE COUNTER MEDICATION Take 1 Scoop by mouth daily. Elviation whey protein blend     oxyCODONE  (OXY IR/ROXICODONE ) 5 MG immediate release tablet Take 1 tablet (5 mg total) by mouth every 6 (six) hours as needed for Pain (For pain not controlled with Tylenol  and/or Ibuprofen ) for up to 5 days 20 tablet 0   prochlorperazine (COMPAZINE) 10 MG tablet Take 1 tablet (10 mg total) by mouth every 6 (six) hours as needed for nausea or vomiting. 30 tablet 2   No current facility-administered medications for this visit.   Facility-Administered Medications Ordered in Other Visits  Medication Dose Route Frequency Provider Last Rate Last Admin   dextrose 5 % solution   Intravenous Continuous Lanny Callander, MD   Stopped at 02/19/24 1530    PHYSICAL EXAMINATION: ECOG PERFORMANCE STATUS: 1 - Symptomatic but completely ambulatory  Vitals:   02/19/24 1050 02/19/24 1051  BP:    Pulse:    Resp:    Temp:    SpO2: 97% 98%   Wt Readings from Last 3 Encounters:  02/19/24 146 lb 6.4 oz (66.4 kg)  02/04/24 137 lb 4.8 oz (62.3 kg)  01/20/24 148 lb 12.8 oz (67.5 kg)     GENERAL:alert, no distress and comfortable SKIN: skin color, texture, turgor are normal, no rashes or significant lesions EYES: normal, Conjunctiva are pink and non-injected, sclera clear NECK: supple, thyroid normal size, non-tender, without nodularity LYMPH:  no palpable lymphadenopathy in the cervical, axillary  LUNGS: clear to auscultation and percussion with normal breathing effort HEART: regular rate & rhythm and no murmurs and no lower extremity edema ABDOMEN:abdomen soft, non-tender  and normal bowel sounds Musculoskeletal:no cyanosis of digits and no clubbing  NEURO: alert & oriented x 3 with fluent speech, no focal motor/sensory deficits  Physical Exam   LABORATORY DATA:  I have reviewed the data as listed    Latest Ref Rng & Units 02/19/2024    9:46 AM 02/04/2024   12:49 PM 01/21/2024    4:27 AM  CBC  WBC 4.0 - 10.5 K/uL 7.6  7.7  9.0   Hemoglobin 13.0 - 17.0 g/dL 89.4  9.9  9.1   Hematocrit 39.0 - 52.0 % 33.6  33.0  30.8   Platelets 150 - 400 K/uL 383  469  514         Latest Ref Rng & Units 02/19/2024    9:46 AM 02/04/2024   12:49 PM 01/21/2024    4:27 AM  CMP  Glucose  70 - 99 mg/dL 895  887  82   BUN 6 - 20 mg/dL 17  20  8    Creatinine 0.61 - 1.24 mg/dL 9.14  8.96  8.99   Sodium 135 - 145 mmol/L 140  141  135   Potassium 3.5 - 5.1 mmol/L 3.6  3.9  4.4   Chloride 98 - 111 mmol/L 108  105  101   CO2 22 - 32 mmol/L 27  30  24    Calcium 8.9 - 10.3 mg/dL 9.3  9.8  9.0   Total Protein 6.5 - 8.1 g/dL 7.4  7.5    Total Bilirubin 0.0 - 1.2 mg/dL 0.3  0.4    Alkaline Phos 38 - 126 U/L 78  79    AST 15 - 41 U/L 18  19    ALT 0 - 44 U/L 45  39        RADIOGRAPHIC STUDIES: I have personally reviewed the radiological images as listed and agreed with the findings in the report. No results found.    No orders of the defined types were placed in this encounter.  All questions were answered. The patient knows to call the clinic with any problems, questions or concerns. No barriers to learning was detected. The total time spent in the appointment was 40 minutes, including review of chart and various tests results, discussions about plan of care and coordination of care plan     Onita Mattock, MD 02/19/2024

## 2024-02-19 NOTE — Progress Notes (Signed)
 PATIENT NAVIGATOR PROGRESS NOTE  Name: William Blankenship Date: 02/19/2024 MRN: [id number]  DOB: 14-Sep-1975   Patient is established with a treatment plan and is actively engaged in care. Nurse Navigator services not currently indicated at this time. Will re-evaluate if needs change or if additional support is requested.

## 2024-02-19 NOTE — Assessment & Plan Note (Signed)
 pT3N0M0 stage II, G3, dMMR with MSH6 loss  - Patient presents with worsening intermittent right sided abdominal pain, CT on December 24, 2023 showed large mass in the ascending colon with colonic and mesenteric lymphadenopathy, no distant metastasis. -Colonoscopy biopsy confirmed adenocarcinoma.  He underwent right hemicolectomy and lymph node dissection.  Surgical path showed poorly differentiated adenocarcinoma, T3, all 44 lymph nodes negative with negative margins. -Due to his young age and poorly differentiated adenocarcinoma, and MSH disease, I recommend adjuvant chemotherapy CapeOx or FOLFOX for 3 months. 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

## 2024-02-19 NOTE — Progress Notes (Signed)
 Patient provided documents at check-in to complete grant process. Patient approved for one-time $1000 [employer] grant to assist with personal expenses while going through treatment. He received a copy of the approval letter and expense sheet in green folder to review and contact me 02/20/24 at earliest convenience to discuss.  He has my card to do so and for any additional financial questions or concerns.

## 2024-02-19 NOTE — Patient Instructions (Signed)
 Instrucciones al darle de alta: Discharge Instructions Gracias por elegir al [facility] de [facility] de [place] para brindarle atencin mdica de oncologa y teacher, english as a foreign language.   Si usted tiene una cita de laboratorio con el [facility], por favor vaya directamente al [employer] de Cncer y regstrese en el rea de engineer, maintenance (it).   Use ropa cmoda y adecuada para tener fcil acceso a las vas del Portacath (acceso venoso de larga duracin) o la lnea PICC (catter central colocado por va perifrica).   Nos esforzamos por ofrecerle tiempo de calidad con su proveedor. Es posible que tenga que volver a programar su cita si llega tarde (15 minutos o ms).  El llegar tarde le afecta a usted y a otros pacientes cuyas citas son posteriores a armed forces operational officer.  Adems, si usted falta a tres o ms citas sin avisar a la oficina, puede ser retirado(a) de la clnica a discrecin del proveedor.      Para las solicitudes de renovacin de recetas, pida a su farmacia que se ponga en contacto con nuestra oficina y deje que transcurran 72 horas para que se complete el proceso de las renovaciones.    Hoy usted recibi los siguientes agentes de quimioterapia e/o inmunoterapia :Oxaliplatin (Eloxatin)   Para ayudar a prevenir las nuseas y los vmitos despus de su tratamiento, le recomendamos que tome su medicamento para las nuseas segn las indicaciones.  LOS SNTOMAS QUE DEBEN COMUNICARSE INMEDIATAMENTE SE INDICAN A CONTINUACIN: *FIEBRE SUPERIOR A 100.4 F (38 C) O MS *ESCALOFROS O SUDORACIN *NUSEAS Y VMITOS QUE NO SE CONTROLAN CON EL MEDICAMENTO PARA LAS NUSEAS *DIFICULTAD INUSUAL PARA RESPIRAR  *MORETONES O HEMORRAGIAS NO HABITUALES *PROBLEMAS URINARIOS (dolor o ardor al geographical information systems officer o frecuencia para geographical information systems officer) *PROBLEMAS INTESTINALES (diarrea inusual, estreimiento, dolor cerca del ano) SENSIBILIDAD EN LA BOCA Y EN LA GARGANTA CON O SIN LA PRESENCIA DE LCERAS (dolor de garganta, llagas en la boca o dolor de  muelas/dientes) ERUPCIN, HINCHAZN O DOLORES INUSUALES FLUJO VAGINAL INUSUAL O PICAZN/RASQUIA    Los puntos marcados con un asterisco ( *) indican una posible emergencia y debe hacer un seguimiento tan pronto como le sea posible o vaya al Departamento de Emergencias si se le presenta algn problema.  Por favor, muestre la [place] DE ADVERTENCIA DE [facility] [facility] DE ADVERTENCIA DE LTANYA al registrarse en [address] de Emergencias y a la enfermera de triaje.  Si tiene preguntas despus de su visita o necesita cancelar o volver a programar su cita, por favor pngase en contacto con [facility] - A [facility]PT OF [facility]  Dept: [phone] y siga las instrucciones. Las horas de oficina son de 8:00 a.m. a 4:30 p.m. de lunes a viernes. Por favor, tenga en cuenta que los mensajes de voz que se dejan despus de las 4:00 p.m. posiblemente no se devolvern hasta el siguiente da de trabajo.  Cerramos los fines de semana y [employer]. En todo momento tiene acceso a una enfermera para preguntas urgentes. Por favor, llame al nmero principal de la clnica Dept: [phone] y siga las instrucciones.   Para cualquier pregunta que no sea de carcter urgente, tambin puede ponerse en contacto con su proveedor utilizando MyChart. Ahora ofrecemos visitas electrnicas para cualquier persona mayor de 18 aos que solicite atencin mdica en lnea para los sntomas que no sean urgentes. Para ms detalles vaya a [email or web address].   Tambin puede bajar la aplicacin de MyChart! Vaya a la tienda de  aplicaciones, busque MyChart, abra la aplicacin, seleccione [place], e ingrese con su nombre de usuario y la contrasea de Clinical Cytogeneticist.

## 2024-02-20 ENCOUNTER — Telehealth: Payer: Self-pay

## 2024-02-20 NOTE — Telephone Encounter (Signed)
-----   Message from Nurse Deland RAMAN sent at 02/19/2024  3:29 PM EDT ----- Regarding: 1st time !st time Oxaliplatin, pt of Dr Lanny, pt did very well, no complaints, pt educated to call with any complaints

## 2024-02-25 ENCOUNTER — Other Ambulatory Visit (HOSPITAL_COMMUNITY): Payer: Self-pay

## 2024-02-25 ENCOUNTER — Encounter: Payer: Self-pay | Admitting: Hematology

## 2024-02-25 MED ORDER — OXYCODONE HCL 5 MG PO TABS
5.0000 mg | ORAL_TABLET | Freq: Four times a day (QID) | ORAL | 0 refills | Status: DC | PRN
  Filled 2024-02-25: qty 10, 3d supply, fill #0

## 2024-02-26 ENCOUNTER — Other Ambulatory Visit: Payer: Self-pay | Admitting: Hematology

## 2024-02-26 ENCOUNTER — Other Ambulatory Visit: Payer: Self-pay

## 2024-02-26 ENCOUNTER — Other Ambulatory Visit (HOSPITAL_COMMUNITY): Payer: Self-pay

## 2024-02-26 DIAGNOSIS — C182 Malignant neoplasm of ascending colon: Secondary | ICD-10-CM

## 2024-02-26 MED ORDER — CAPECITABINE 500 MG PO TABS
ORAL_TABLET | ORAL | 0 refills | Status: DC
Start: 2024-02-26 — End: 2024-02-27
  Filled 2024-02-26: qty 84, fill #0

## 2024-02-27 ENCOUNTER — Other Ambulatory Visit (HOSPITAL_COMMUNITY): Payer: Self-pay

## 2024-02-27 ENCOUNTER — Other Ambulatory Visit: Payer: Self-pay

## 2024-02-27 ENCOUNTER — Encounter: Payer: Self-pay | Admitting: Hematology

## 2024-02-27 ENCOUNTER — Telehealth: Payer: Self-pay | Admitting: Hematology

## 2024-02-27 ENCOUNTER — Inpatient Hospital Stay: Payer: Self-pay | Attending: Hematology | Admitting: Hematology

## 2024-02-27 DIAGNOSIS — Z79630 Long term (current) use of alkylating agent: Secondary | ICD-10-CM | POA: Insufficient documentation

## 2024-02-27 DIAGNOSIS — Z5111 Encounter for antineoplastic chemotherapy: Secondary | ICD-10-CM | POA: Insufficient documentation

## 2024-02-27 DIAGNOSIS — C182 Malignant neoplasm of ascending colon: Secondary | ICD-10-CM | POA: Insufficient documentation

## 2024-02-27 MED ORDER — CAPECITABINE 500 MG PO TABS
ORAL_TABLET | ORAL | 1 refills | Status: DC
  Filled 2024-02-28 – 2024-03-06 (×2): qty 84, 21d supply, fill #0
  Filled 2024-03-26 – 2024-03-29 (×2): qty 84, 21d supply, fill #1

## 2024-02-27 NOTE — Progress Notes (Signed)
 [facility]   [phone] [phone]) [phone]   Clinic Follow up Note   Patient Care Team: Pcp, No as PCP - General 02/27/2024  I connected with William Blankenship on 02/27/24 at  3:40 PM EST by telephone and verified that I am speaking with the correct person using two identifiers.   I discussed the limitations, risks, security and privacy concerns of performing an evaluation and management service by telephone and the availability of in person appointments. I also discussed with the patient that there may be a patient responsible charge related to this service. The patient expressed understanding and agreed to proceed.   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 -Colonoscopy biopsy confirmed adenocarcinoma.  He underwent right hemicolectomy and lymph node dissection.  Surgical path showed poorly differentiated adenocarcinoma, T3, all 44 lymph nodes negative with negative margins. -Due to his young age and poorly differentiated adenocarcinoma, and MSH disease, I recommend adjuvant chemotherapy CapeOx or FOLFOX for 3 months. 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   Assessment & Plan Malignant neoplasm of ascending colon on adjuvant chemotherapy Currently undergoing adjuvant chemotherapy with oral and intravenous components. Reports transient loss of taste post-infusion, lasting approximately three days, but able to maintain hydration with liquids. No significant weight loss reported. No adverse reactions to oral  chemotherapy, such as skin rash or diarrhea. Experienced weakness and near syncope post-infusion, but no recurrence. Plans to return to work on November 17th, 2025, and prefers chemotherapy schedule adjustments to accommodate work commitments. - Scheduled next chemotherapy cycle for November 21st, 2025, to allow rest over the weekend. - Coordinated with pharmacy to ensure oral chemotherapy pills are available by November 18th, 2025. - Instructed to start oral chemotherapy on the same day as the next infusion to enhance efficacy. - Advised to maintain adequate hydration to prevent weakness and syncope. - Will schedule future chemotherapy sessions on Fridays to align with work schedule.  Plan - He tolerated first cycle chemotherapy overall well, he is on second week of Xeloda, no significant side effects. -I refilled capecitabine - Will schedule his second cycle of chemotherapy on Friday 11/21, per his request -f/u on 11/20 or 11/21    SUMMARY OF ONCOLOGIC HISTORY: Oncology History  Cancer of right colon (HCC)  01/18/2024 Cancer Staging   Staging form: Colon and Rectum, AJCC 8th Edition - Pathologic stage from 01/18/2024: Stage IIA (pT3, pN0, cM0) - Signed by Lanny Callander, MD on 02/03/2024 Total positive nodes: 0 Histologic grading system: 4 grade system Histologic grade (G): G3 Residual tumor (R): R0   02/03/2024 Initial Diagnosis   Cancer of right colon (HCC)   02/19/2024 -  Chemotherapy   Patient is on Treatment Plan : GASTRIC CapeOx (1000/130) q21d x 8 Cycles        Discussed the use of AI scribe software for clinical note transcription with the patient, who gave verbal consent to proceed.  History of Present Illness William Blankenship is a 48 year old male with colon cancer who presents for follow-up of adjuvant chemotherapy.  He experienced a loss  of taste for everything following his last intravenous chemotherapy infusion, lasting about three days. During this time, he  maintained hydration with liquids and Pedialyte. He feels better this week and has returned to eating normally, though he feels constantly hungry. No further episodes of near syncope have occurred. There is no skin rash or diarrhea from the oral chemotherapy pills. Itching at the needle insertion site persisted until the Friday after the infusion but has resolved.     REVIEW OF SYSTEMS:   Constitutional: Denies fevers, chills or abnormal weight loss Eyes: Denies blurriness of vision Ears, nose, mouth, throat, and face: Denies mucositis or sore throat Respiratory: Denies cough, dyspnea or wheezes Cardiovascular: Denies palpitation, chest discomfort or lower extremity swelling Gastrointestinal:  Denies nausea, heartburn or change in bowel habits Skin: Denies abnormal skin rashes Lymphatics: Denies new lymphadenopathy or easy bruising Neurological:Denies numbness, tingling or new weaknesses Behavioral/Psych: Mood is stable, no new changes  All other systems were reviewed with the patient and are negative.  MEDICAL HISTORY:  Past Medical History:  Diagnosis Date   Abrasion of anterior lower leg 07/29/2012   Anemia    Back pain    Colonic mass    GERD (gastroesophageal reflux disease)    Hand injury 03/31/2013   Hyperlipidemia    Migraines    PATELLAR DISLOCATION, LEFT 01/31/2010   Qualifier: Diagnosis of  By: Rosalynn MD, Camie      SURGICAL HISTORY: Past Surgical History:  Procedure Laterality Date   conoscopy      LAPAROSCOPIC ILEOCECECTOMY Right 01/18/2024   Procedure: EXCISION, CECUM WITH ILEUM, LAPAROSCOPIC;  Surgeon: Teresa Lonni HERO, MD;  Location: [facility];  Service: General;  Laterality: Right;  LAPAROSCOPIC RIGHT HEMICOLECTOMY    I have reviewed the social history and family history with the patient and they are unchanged from previous note.  ALLERGIES:  has no known allergies.  MEDICATIONS:  Current Outpatient Medications  Medication Sig Dispense Refill    acetaminophen  (TYLENOL ) 325 MG tablet Take 650 mg by mouth every 6 (six) hours as needed for moderate pain (pain score 4-6).     azelastine  (ASTELIN ) 0.1 % nasal spray Place 1 spray into both nostrils 2 (two) times daily. Use in each nostril as directed 30 mL 1   Calcium Carbonate (CALCIUM 500 PO) Take 1 tablet by mouth daily. (Patient not taking: Reported on 02/08/2024)     CALCIUM MAGNESIUM ZINC PO Take 1 tablet by mouth daily. (Patient not taking: Reported on 02/08/2024)     capecitabine (XELODA) 500 MG tablet Take 3 tablets (1500 mg total) by mouth 2 (two) times daily after meals. Take 30 minutes after meal. Take for 14 days, then off for 7 days. Repeat every 21 days. Start on same day of IV chemo 84 tablet 1   Cetirizine  HCl 10 MG CAPS Take 1 capsule (10 mg total) by mouth daily for 10 days. (Patient not taking: Reported on 02/08/2024) 10 capsule 0   fluticasone  (FLONASE ) 50 MCG/ACT nasal spray Place 1-2 sprays into both nostrils daily for 7 days. (Patient not taking: No sig reported) 1 g 0   ibuprofen  (ADVIL ) 600 MG tablet Take 1 tablet (600 mg total) by mouth every 6 (six) hours as needed. (Patient not taking: Reported on 02/08/2024) 30 tablet 0   Iron  Combinations (IRON  COMPLEX PO) Take 1 tablet by mouth daily.     Misc Natural Products (APPLE CIDER VINEGAR DIET PO) Take 1 capsule by mouth daily. (Patient not taking: Reported on 02/08/2024)  Nutritional Supplements (ENSURE ORIGINAL) LIQD Take 1 Bottle by mouth daily.     ondansetron  (ZOFRAN ) 8 MG tablet Take 1 tablet (8 mg total) by mouth every 8 (eight) hours as needed for nausea or vomiting. 20 tablet 2   OVER THE COUNTER MEDICATION Take 1 Scoop by mouth daily. Elviation whey protein blend     oxyCODONE  (OXY IR/ROXICODONE ) 5 MG immediate release tablet Take 1 tablet (5 mg total) by mouth every 6 (six) hours as needed (postop pain not controlled with tylenol  and ibuprofen  first) 10 tablet 0   prochlorperazine (COMPAZINE) 10 MG tablet Take 1  tablet (10 mg total) by mouth every 6 (six) hours as needed for nausea or vomiting. 30 tablet 2   No current facility-administered medications for this visit.    PHYSICAL EXAMINATION: Not performed   LABORATORY DATA:  I have reviewed the data as listed    Latest Ref Rng & Units 02/19/2024    9:46 AM 02/04/2024   12:49 PM 01/21/2024    4:27 AM  CBC  WBC 4.0 - 10.5 K/uL 7.6  7.7  9.0   Hemoglobin 13.0 - 17.0 g/dL 89.4  9.9  9.1   Hematocrit 39.0 - 52.0 % 33.6  33.0  30.8   Platelets 150 - 400 K/uL 383  469  514         Latest Ref Rng & Units 02/19/2024    9:46 AM 02/04/2024   12:49 PM 01/21/2024    4:27 AM  CMP  Glucose 70 - 99 mg/dL 895  887  82   BUN 6 - 20 mg/dL 17  20  8    Creatinine 0.61 - 1.24 mg/dL 9.14  8.96  8.99   Sodium 135 - 145 mmol/L 140  141  135   Potassium 3.5 - 5.1 mmol/L 3.6  3.9  4.4   Chloride 98 - 111 mmol/L 108  105  101   CO2 22 - 32 mmol/L 27  30  24    Calcium 8.9 - 10.3 mg/dL 9.3  9.8  9.0   Total Protein 6.5 - 8.1 g/dL 7.4  7.5    Total Bilirubin 0.0 - 1.2 mg/dL 0.3  0.4    Alkaline Phos 38 - 126 U/L 78  79    AST 15 - 41 U/L 18  19    ALT 0 - 44 U/L 45  39        RADIOGRAPHIC STUDIES: I have personally reviewed the radiological images as listed and agreed with the findings in the report. No results found.     I discussed the assessment and treatment plan with the patient. The patient was provided an opportunity to ask questions and all were answered. The patient agreed with the plan and demonstrated an understanding of the instructions.   The patient was advised to call back or seek an in-person evaluation if the symptoms worsen or if the condition fails to improve as anticipated.  I provided 15 minutes of non face-to-face telephone visit time during this encounter, including review of chart and various tests results, discussions about plan of care and coordination of care plan.    Onita Mattock, MD 02/27/24

## 2024-02-27 NOTE — Assessment & Plan Note (Addendum)
 pT3N0M0 stage II, G3, dMMR with MSH6 loss  - Patient presents with worsening intermittent right sided abdominal pain, CT on December 24, 2023 showed large mass in the ascending colon with colonic and mesenteric lymphadenopathy, no distant metastasis. -Colonoscopy biopsy confirmed adenocarcinoma.  He underwent right hemicolectomy and lymph node dissection.  Surgical path showed poorly differentiated adenocarcinoma, T3, all 44 lymph nodes negative with negative margins. -Due to his young age and poorly differentiated adenocarcinoma, and MSH disease, I recommend adjuvant chemotherapy CapeOx or FOLFOX for 3 months. 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

## 2024-02-27 NOTE — Telephone Encounter (Signed)
 Pt son is aware of the apt appt and times and dates.

## 2024-02-28 ENCOUNTER — Other Ambulatory Visit: Payer: Self-pay

## 2024-02-28 ENCOUNTER — Other Ambulatory Visit (HOSPITAL_COMMUNITY): Payer: Self-pay

## 2024-03-04 ENCOUNTER — Other Ambulatory Visit: Payer: Self-pay

## 2024-03-06 ENCOUNTER — Other Ambulatory Visit (HOSPITAL_COMMUNITY): Payer: Self-pay

## 2024-03-06 ENCOUNTER — Encounter: Payer: Self-pay | Admitting: Hematology

## 2024-03-06 ENCOUNTER — Other Ambulatory Visit: Payer: Self-pay

## 2024-03-06 ENCOUNTER — Other Ambulatory Visit: Payer: Self-pay | Admitting: Pharmacy Technician

## 2024-03-06 NOTE — Progress Notes (Signed)
 Specialty Pharmacy Refill Coordination Note  William Blankenship is a 48 y.o. male contacted today regarding refills of specialty medication(s) Capecitabine (XELODA)   Patient requested [name] at [facility] at [place] date: 03/07/24   Medication will be filled on: 03/06/24

## 2024-03-06 NOTE — Progress Notes (Signed)
 Specialty Pharmacy Ongoing Clinical Assessment Note  William Blankenship is a 48 y.o. male who is being followed by the specialty pharmacy service for RxSp Oncology   Patient's specialty medication(s) reviewed today: Capecitabine (XELODA)   Missed doses in the last 4 weeks: 0   Patient/Caregiver did not have any additional questions or concerns.   Therapeutic benefit summary: Unable to assess   Adverse events/side effects summary: No adverse events/side effects   Patient's therapy is appropriate to: Continue    Goals Addressed             This Visit's Progress    Achieve a cure       Patient is unable to be assessed as therapy was recently initiated. Patient will maintain adherence         Follow up: 3 months  [facility] Specialty Pharmacist

## 2024-03-13 ENCOUNTER — Inpatient Hospital Stay: Payer: Self-pay | Admitting: Nutrition

## 2024-03-13 ENCOUNTER — Inpatient Hospital Stay: Payer: Self-pay

## 2024-03-13 ENCOUNTER — Inpatient Hospital Stay (HOSPITAL_BASED_OUTPATIENT_CLINIC_OR_DEPARTMENT_OTHER): Payer: Self-pay | Admitting: Hematology

## 2024-03-13 VITALS — BP 122/89 | HR 76 | Temp 97.7°F | Resp 18 | Wt 149.0 lb

## 2024-03-13 DIAGNOSIS — C182 Malignant neoplasm of ascending colon: Secondary | ICD-10-CM

## 2024-03-13 LAB — COMPREHENSIVE METABOLIC PANEL WITH GFR
ALT: 104 U/L — ABNORMAL HIGH (ref 0–44)
AST: 50 U/L — ABNORMAL HIGH (ref 15–41)
Albumin: 4.7 g/dL (ref 3.5–5.0)
Alkaline Phosphatase: 111 U/L (ref 38–126)
Anion gap: 12 (ref 5–15)
BUN: 18 mg/dL (ref 6–20)
CO2: 23 mmol/L (ref 22–32)
Calcium: 9.5 mg/dL (ref 8.9–10.3)
Chloride: 102 mmol/L (ref 98–111)
Creatinine, Ser: 0.82 mg/dL (ref 0.61–1.24)
GFR, Estimated: >60 mL/min (ref >60)
Glucose, Bld: 128 mg/dL — ABNORMAL HIGH (ref 70–99)
Potassium: 3.7 mmol/L (ref 3.5–5.1)
Sodium: 138 mmol/L (ref 135–145)
Total Bilirubin: 0.5 mg/dL (ref 0.0–1.2)
Total Protein: 7.9 g/dL (ref 6.5–8.1)

## 2024-03-13 LAB — CBC WITH DIFFERENTIAL/PLATELET
Abs Immature Granulocytes: 0.01 K/uL (ref 0.00–0.07)
Basophils Absolute: 0 K/uL (ref 0.0–0.1)
Basophils Relative: 0 %
Eosinophils Absolute: 0.2 K/uL (ref 0.0–0.5)
Eosinophils Relative: 3 %
HCT: 37.3 % — ABNORMAL LOW (ref 39.0–52.0)
Hemoglobin: 12 g/dL — ABNORMAL LOW (ref 13.0–17.0)
Immature Granulocytes: 0 %
Lymphocytes Relative: 38 %
Lymphs Abs: 1.8 K/uL (ref 0.7–4.0)
MCH: 24 pg — ABNORMAL LOW (ref 26.0–34.0)
MCHC: 32.2 g/dL (ref 30.0–36.0)
MCV: 74.5 fL — ABNORMAL LOW (ref 80.0–100.0)
Monocytes Absolute: 0.6 K/uL (ref 0.1–1.0)
Monocytes Relative: 12 %
Neutro Abs: 2.3 K/uL (ref 1.7–7.7)
Neutrophils Relative %: 47 %
Platelets: 355 K/uL (ref 150–400)
RBC: 5.01 MIL/uL (ref 4.22–5.81)
RDW: 23.3 % — ABNORMAL HIGH (ref 11.5–15.5)
WBC: 4.9 K/uL (ref 4.0–10.5)
nRBC: 0 % (ref 0.0–0.2)

## 2024-03-13 LAB — CEA (ACCESS): CEA (CHCC): 1.11 ng/mL (ref 0.00–5.00)

## 2024-03-13 LAB — FERRITIN: Ferritin: 19 ng/mL — ABNORMAL LOW (ref 24–336)

## 2024-03-13 MED ORDER — OXALIPLATIN CHEMO INJECTION 100 MG/20ML
130.0000 mg/m2 | Freq: Once | INTRAVENOUS | Status: AC
  Administered 2024-03-13: 200 mg via INTRAVENOUS
  Filled 2024-03-13: qty 5.23

## 2024-03-13 MED ORDER — PALONOSETRON HCL INJECTION 0.25 MG/5ML
0.2500 mg | Freq: Once | INTRAVENOUS | Status: AC
  Administered 2024-03-13: 0.25 mg via INTRAVENOUS
  Filled 2024-03-13: qty 5

## 2024-03-13 MED ORDER — DEXAMETHASONE SOD PHOSPHATE PF 10 MG/ML IJ SOLN
10.0000 mg | Freq: Once | INTRAMUSCULAR | Status: AC
  Administered 2024-03-13: 10 mg via INTRAVENOUS

## 2024-03-13 MED ORDER — DEXTROSE 5 % IV SOLN: INTRAVENOUS | Status: DC

## 2024-03-13 NOTE — Progress Notes (Signed)
 Nutrition follow-up completed with patient during infusion for colon cancer status post right hemicolectomy.  Patient is followed by Dr. Lanny and is receiving CapeOx.  Labs include glucose 128   weight: 149 pounds November 20 146 pounds 6.4 ounces October 28 Usual body weight per patient 160 pounds.  Patient reports taste alterations after infusion.  He states it is more difficult for him to eat for about 3 days after his treatment.  Usually has liquids most of the time but admits that he is hungry.  He has purchased oral nutrition supplements so that he will have those available after his treatment today. Expresses desire to maintain current weight.  Requested additional information about healthy diet.  Nutrition diagnosis: Food and nutrition related knowledge deficit, improved.  Intervention: Continue strategies for small amounts of food frequently throughout the day. Focus on high-protein foods. Oral nutrition supplements as needed. Brief education provided about healthier food choices.  Monitoring, evaluation, goals: Tolerate adequate calories and protein to minimize loss of lean body mass.  Next visit: To be scheduled as needed.  **Disclaimer: This note was dictated with voice recognition software. Similar sounding words can inadvertently be transcribed and this note may contain transcription errors which may not have been corrected upon publication of note.**

## 2024-03-13 NOTE — Assessment & Plan Note (Signed)
 pT3N0M0 stage II, G3, dMMR with MSH6 loss  - Patient presents with worsening intermittent right sided abdominal pain, CT on December 24, 2023 showed large mass in the ascending colon with colonic and mesenteric lymphadenopathy, no distant metastasis. -Colonoscopy biopsy confirmed adenocarcinoma.  He underwent right hemicolectomy and lymph node dissection.  Surgical path showed poorly differentiated adenocarcinoma, T3, all 44 lymph nodes negative with negative margins. -Due to his young age and poorly differentiated adenocarcinoma, and MSH disease, I recommend adjuvant chemotherapy CapeOx or FOLFOX for 3 months. 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

## 2024-03-13 NOTE — Progress Notes (Signed)
 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 -Colonoscopy biopsy confirmed adenocarcinoma.  He underwent right hemicolectomy and lymph node dissection.  Surgical path showed poorly differentiated adenocarcinoma, T3, all 44 lymph nodes negative with negative margins. -Due to his young age and poorly differentiated adenocarcinoma, and MSH disease, I recommend adjuvant chemotherapy CapeOx or FOLFOX for 3 months. 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  Assessment & Plan Malignant neoplasm of ascending colon on adjuvant chemotherapy Continues on adjuvant chemotherapy with no significant weight loss, indicating good tolerance. Liver enzymes slightly elevated, likely due to chemotherapy, but bilirubin is normal. No alcohol consumption reported, which is advised during chemotherapy. - Continue adjuvant chemotherapy regimen - Advised against alcohol consumption during chemotherapy  Postoperative pain after colon cancer surgery Persistent postoperative pain likely due to scar tissue. Pain exacerbated by heat exposure. Ibuprofen  recommended for pain management, with caution due to potential stomach irritation from chemotherapy. Tylenol  suggested as an alternative if ibuprofen  causes stomach upset. - Use ibuprofen  3-4 times a day for pain management, ensuring it does not irritate the stomach - Consider Tylenol  as an alternative if ibuprofen   causes stomach upset - Use Pepcid to protect the stomach if ibuprofen  is taken regularly - Avoid heat exposure to the surgical site  Chemotherapy-induced nausea Nausea occurs 2-3 days post-chemotherapy, with altered taste. Managed with Compazine and Zofran , with Zofran  to be started after two days post-treatment. - Continue Compazine as needed for nausea - Start Zofran  after two days post-chemotherapy  Chemotherapy-induced fatigue Experiencing fatigue, common with chemotherapy treatment.  Elevated liver enzymes likely secondary to chemotherapy Slight elevation in liver enzymes, likely due to chemotherapy. Bilirubin levels are normal, indicating no significant liver dysfunction.  Plan - Lab reviewed, adequate for treatment, will proceed to second cycle oxaliplatin today, she will start Xeloda at home - Follow-up in 3 weeks before cycle 3.   SUMMARY OF ONCOLOGIC HISTORY: Oncology History  Cancer of right colon (HCC)  01/18/2024 Cancer Staging   Staging form: Colon and Rectum, AJCC 8th Edition - Pathologic stage from 01/18/2024: Stage IIA (pT3, pN0, cM0) - Signed by Lanny Callander, MD on 02/03/2024 Total positive nodes: 0 Histologic grading system: 4 grade system Histologic grade (G): G3 Residual tumor (R): R0   02/03/2024 Initial Diagnosis   Cancer of right colon (HCC)   02/19/2024 -  Chemotherapy   Patient is on Treatment Plan : GASTRIC CapeOx (1000/130) q21d x 8 Cycles         Discussed the use of AI scribe software for clinical note transcription with the patient, who gave verbal consent to proceed.  History of Present Illness William Blankenship is a 48 year old male with colon cancer on adjuvant chemotherapy who presents for follow-up.  He experiences facial pain associated with dizziness, which began post-surgery. An episode of dizziness occurred while working outside, but he avoided syncope. Tylenol  is ineffective for pain relief, and he avoids ibuprofen  due to  potential  stomach irritation from chemotherapy. He is not taking stomach medications. Discomfort increases near heat, particularly around the surgical site.  He has gained weight from 136 to 149 pounds, with no recent weight loss. Food tastes unpleasant for two to three days post-chemotherapy, but appetite and taste return thereafter. No headaches or syncope are present.     All other systems were reviewed with the patient and are negative.  MEDICAL HISTORY:  Past Medical History:  Diagnosis Date   Abrasion of anterior lower leg 07/29/2012   Anemia    Back pain    Colonic mass    GERD (gastroesophageal reflux disease)    Hand injury 03/31/2013   Hyperlipidemia    Migraines    PATELLAR DISLOCATION, LEFT 01/31/2010   Qualifier: Diagnosis of  By: Rosalynn MD, Camie      SURGICAL HISTORY: Past Surgical History:  Procedure Laterality Date   conoscopy      LAPAROSCOPIC ILEOCECECTOMY Right 01/18/2024   Procedure: EXCISION, CECUM WITH ILEUM, LAPAROSCOPIC;  Surgeon: Teresa Lonni HERO, MD;  Location: [facility];  Service: General;  Laterality: Right;  LAPAROSCOPIC RIGHT HEMICOLECTOMY    I have reviewed the social history and family history with the patient and they are unchanged from previous note.  ALLERGIES:  has no known allergies.  MEDICATIONS:  Current Outpatient Medications  Medication Sig Dispense Refill   acetaminophen  (TYLENOL ) 325 MG tablet Take 650 mg by mouth every 6 (six) hours as needed for moderate pain (pain score 4-6).     azelastine  (ASTELIN ) 0.1 % nasal spray Place 1 spray into both nostrils 2 (two) times daily. Use in each nostril as directed 30 mL 1   Calcium Carbonate (CALCIUM 500 PO) Take 1 tablet by mouth daily. (Patient not taking: Reported on 02/08/2024)     CALCIUM MAGNESIUM ZINC PO Take 1 tablet by mouth daily. (Patient not taking: Reported on 02/08/2024)     capecitabine (XELODA) 500 MG tablet Take 3 tablets (1500 mg total) by mouth 2 (two) times daily after meals. Take 30  minutes after meal. Take for 14 days, then off for 7 days. Repeat every 21 days. Start on same day of IV chemo 84 tablet 1   Cetirizine  HCl 10 MG CAPS Take 1 capsule (10 mg total) by mouth daily for 10 days. (Patient not taking: Reported on 02/08/2024) 10 capsule 0   fluticasone  (FLONASE ) 50 MCG/ACT nasal spray Place 1-2 sprays into both nostrils daily for 7 days. (Patient not taking: No sig reported) 1 g 0   ibuprofen  (ADVIL ) 600 MG tablet Take 1 tablet (600 mg total) by mouth every 6 (six) hours as needed. (Patient not taking: Reported on 02/08/2024) 30 tablet 0   Iron  Combinations (IRON  COMPLEX PO) Take 1 tablet by mouth daily.     Misc Natural Products (APPLE CIDER VINEGAR DIET PO) Take 1 capsule by mouth daily. (Patient not taking: Reported on 02/08/2024)     Nutritional Supplements (ENSURE ORIGINAL) LIQD Take 1 Bottle by mouth daily.     ondansetron  (ZOFRAN ) 8 MG tablet Take 1 tablet (8 mg total) by mouth every 8 (eight) hours as needed for nausea or vomiting. 20 tablet 2   OVER THE COUNTER MEDICATION Take 1 Scoop by mouth daily. Elviation whey protein blend     oxyCODONE  (OXY IR/ROXICODONE ) 5 MG immediate release tablet Take 1 tablet (5 mg total) by mouth every 6 (six) hours as needed (postop pain not controlled with tylenol  and ibuprofen  first) 10 tablet 0  prochlorperazine (COMPAZINE) 10 MG tablet Take 1 tablet (10 mg total) by mouth every 6 (six) hours as needed for nausea or vomiting. 30 tablet 2   No current facility-administered medications for this visit.   Facility-Administered Medications Ordered in Other Visits  Medication Dose Route Frequency Provider Last Rate Last Admin   dextrose 5 % solution   Intravenous Continuous Lanny Callander, MD 10 mL/hr at 03/13/24 1357 New Bag at 03/13/24 1357   oxaliplatin (ELOXATIN) 200 mg in dextrose 5 % 500 mL chemo infusion  130 mg/m2 (Treatment Plan Recorded) Intravenous Once Lanny Callander, MD        PHYSICAL EXAMINATION: ECOG PERFORMANCE STATUS: 1 -  Symptomatic but completely ambulatory  There were no vitals filed for this visit. Wt Readings from Last 3 Encounters:  03/13/24 149 lb (67.6 kg)  02/19/24 146 lb 6.4 oz (66.4 kg)  02/04/24 137 lb 4.8 oz (62.3 kg)     GENERAL:alert, no distress and comfortable SKIN: skin color, texture, turgor are normal, no rashes or significant lesions EYES: normal, Conjunctiva are pink and non-injected, sclera clear NECK: supple, thyroid normal size, non-tender, without nodularity LYMPH:  no palpable lymphadenopathy in the cervical, axillary  LUNGS: clear to auscultation and percussion with normal breathing effort HEART: regular rate & rhythm and no murmurs and no lower extremity edema ABDOMEN:abdomen soft, non-tender and normal bowel sounds Musculoskeletal:no cyanosis of digits and no clubbing  NEURO: alert & oriented x 3 with fluent speech, no focal motor/sensory deficits  Physical Exam    LABORATORY DATA:  I have reviewed the data as listed    Latest Ref Rng & Units 03/13/2024   11:58 AM 02/19/2024    9:46 AM 02/04/2024   12:49 PM  CBC  WBC 4.0 - 10.5 K/uL 4.9  7.6  7.7   Hemoglobin 13.0 - 17.0 g/dL 87.9  89.4  9.9   Hematocrit 39.0 - 52.0 % 37.3  33.6  33.0   Platelets 150 - 400 K/uL 355  383  469         Latest Ref Rng & Units 03/13/2024   11:58 AM 02/19/2024    9:46 AM 02/04/2024   12:49 PM  CMP  Glucose 70 - 99 mg/dL 871  895  887   BUN 6 - 20 mg/dL 18  17  20    Creatinine 0.61 - 1.24 mg/dL 9.17  9.14  8.96   Sodium 135 - 145 mmol/L 138  140  141   Potassium 3.5 - 5.1 mmol/L 3.7  3.6  3.9   Chloride 98 - 111 mmol/L 102  108  105   CO2 22 - 32 mmol/L 23  27  30    Calcium 8.9 - 10.3 mg/dL 9.5  9.3  9.8   Total Protein 6.5 - 8.1 g/dL 7.9  7.4  7.5   Total Bilirubin 0.0 - 1.2 mg/dL 0.5  0.3  0.4   Alkaline Phos 38 - 126 U/L 111  78  79   AST 15 - 41 U/L 50  18  19   ALT 0 - 44 U/L 104  45  39       RADIOGRAPHIC STUDIES: I have personally reviewed the radiological  images as listed and agreed with the findings in the report. No results found.    No orders of the defined types were placed in this encounter.  All questions were answered. The patient knows to call the clinic with any problems, questions or concerns. No barriers to learning was  detected. The total time spent in the appointment was 25 minutes, including review of chart and various tests results, discussions about plan of care and coordination of care plan     Onita Mattock, MD 03/13/2024

## 2024-03-13 NOTE — Patient Instructions (Signed)
 Instrucciones al darle de alta: Discharge Instructions Gracias por elegir al [facility] de [facility] de [place] para brindarle atencin mdica de oncologa y teacher, english as a foreign language.   Si usted tiene una cita de laboratorio con el [facility], por favor vaya directamente al [employer] de Cncer y regstrese en el rea de engineer, maintenance (it).   Use ropa cmoda y adecuada para tener fcil acceso a las vas del Portacath (acceso venoso de larga duracin) o la lnea PICC (catter central colocado por va perifrica).   Nos esforzamos por ofrecerle tiempo de calidad con su proveedor. Es posible que tenga que volver a programar su cita si llega tarde (15 minutos o ms).  El llegar tarde le afecta a usted y a otros pacientes cuyas citas son posteriores a armed forces operational officer.  Adems, si usted falta a tres o ms citas sin avisar a la oficina, puede ser retirado(a) de la clnica a discrecin del proveedor.      Para las solicitudes de renovacin de recetas, pida a su farmacia que se ponga en contacto con nuestra oficina y deje que transcurran 72 horas para que se complete el proceso de las renovaciones.    Hoy usted recibi los siguientes agentes de quimioterapia e/o inmunoterapia :Oxaliplatin (Eloxatin)   Para ayudar a prevenir las nuseas y los vmitos despus de su tratamiento, le recomendamos que tome su medicamento para las nuseas segn las indicaciones.  LOS SNTOMAS QUE DEBEN COMUNICARSE INMEDIATAMENTE SE INDICAN A CONTINUACIN: *FIEBRE SUPERIOR A 100.4 F (38 C) O MS *ESCALOFROS O SUDORACIN *NUSEAS Y VMITOS QUE NO SE CONTROLAN CON EL MEDICAMENTO PARA LAS NUSEAS *DIFICULTAD INUSUAL PARA RESPIRAR  *MORETONES O HEMORRAGIAS NO HABITUALES *PROBLEMAS URINARIOS (dolor o ardor al geographical information systems officer o frecuencia para geographical information systems officer) *PROBLEMAS INTESTINALES (diarrea inusual, estreimiento, dolor cerca del ano) SENSIBILIDAD EN LA BOCA Y EN LA GARGANTA CON O SIN LA PRESENCIA DE LCERAS (dolor de garganta, llagas en la boca o dolor de  muelas/dientes) ERUPCIN, HINCHAZN O DOLORES INUSUALES FLUJO VAGINAL INUSUAL O PICAZN/RASQUIA    Los puntos marcados con un asterisco ( *) indican una posible emergencia y debe hacer un seguimiento tan pronto como le sea posible o vaya al Departamento de Emergencias si se le presenta algn problema.  Por favor, muestre la [place] DE ADVERTENCIA DE [facility] [facility] DE ADVERTENCIA DE LTANYA al registrarse en [address] de Emergencias y a la enfermera de triaje.  Si tiene preguntas despus de su visita o necesita cancelar o volver a programar su cita, por favor pngase en contacto con [facility] - A [facility]PT OF [facility]  Dept: [phone] y siga las instrucciones. Las horas de oficina son de 8:00 a.m. a 4:30 p.m. de lunes a viernes. Por favor, tenga en cuenta que los mensajes de voz que se dejan despus de las 4:00 p.m. posiblemente no se devolvern hasta el siguiente da de trabajo.  Cerramos los fines de semana y [employer]. En todo momento tiene acceso a una enfermera para preguntas urgentes. Por favor, llame al nmero principal de la clnica Dept: [phone] y siga las instrucciones.   Para cualquier pregunta que no sea de carcter urgente, tambin puede ponerse en contacto con su proveedor utilizando MyChart. Ahora ofrecemos visitas electrnicas para cualquier persona mayor de 18 aos que solicite atencin mdica en lnea para los sntomas que no sean urgentes. Para ms detalles vaya a [email or web address].   Tambin puede bajar la aplicacin de MyChart! Vaya a la tienda de  aplicaciones, busque MyChart, abra la aplicacin, seleccione [place], e ingrese con su nombre de usuario y la contrasea de Clinical Cytogeneticist.

## 2024-03-14 ENCOUNTER — Ambulatory Visit: Payer: Self-pay | Admitting: Nurse Practitioner

## 2024-03-19 ENCOUNTER — Encounter: Payer: Self-pay | Admitting: Hematology

## 2024-03-19 NOTE — Telephone Encounter (Addendum)
 Called and spoke with the patient's son. Informed the son regarding the information below.  Son verbalized understanding   ----- Message from [facility] John BW sent at 03/19/2024 10:43 AM EST -----  ----- Message ----- From: Burton, Lacie K, NP Sent: 03/14/2024  10:18 AM EST To: Norleen KANDICE Spain, CMA; [facility]  Please let patient know ferritin is still low, I recommend oral iron  po once daily if he can tolerate it.   Thanks Lacie NP ----- Message ----- From: Rebecka, [facility] [place] Sent: 03/13/2024  12:26 PM EST To: Onita Mattock, MD

## 2024-03-25 ENCOUNTER — Encounter: Payer: Self-pay | Admitting: Hematology

## 2024-03-25 ENCOUNTER — Other Ambulatory Visit: Payer: Self-pay

## 2024-03-25 NOTE — Progress Notes (Signed)
 In addition to my previous message, I also sent a message to Devere in social work for follow up if any resources available on her end.

## 2024-03-25 NOTE — Progress Notes (Signed)
 Patient appeared at [facility] requesting to speak with me. Attempted to reach patient by phone and his wife answered whom was at home. Advised registration to provide my card and an additional grant expense sheet to reference to should he have questions regarding that and have patient to contact me.  Received call from patient as he had questions/concerns regarding bills/billing calls/payment plans. Advised patient he would need to discuss these concerns with the billing department directly and they would advise if he qualifies to apply for assistance. Patient is not a citizen therefore does not qualify to apply for Medicaid.  Discussed the expense sheet in detail from the [employer] and advised this is how his oral medications are being paid for. Patient verbalized understanding and agrees to continue to use the [employer] grant to pay for his medication only. He has no other questions or concerns.  He has my card for any additional financial questions or concerns.

## 2024-03-26 ENCOUNTER — Other Ambulatory Visit: Payer: Self-pay

## 2024-03-26 ENCOUNTER — Encounter: Payer: Self-pay | Admitting: Hematology

## 2024-03-28 ENCOUNTER — Other Ambulatory Visit (HOSPITAL_COMMUNITY): Payer: Self-pay

## 2024-03-28 ENCOUNTER — Other Ambulatory Visit: Payer: Self-pay

## 2024-03-28 NOTE — Progress Notes (Signed)
 Specialty Pharmacy Refill Coordination Note  William Blankenship is a 48 y.o. male contacted today regarding refills of specialty medication(s) Capecitabine  (XELODA )   Patient requested Delivery   Delivery date: 04/01/24   Verified address: [address] [place] [address]   Medication will be filled on: 03/31/24

## 2024-03-29 ENCOUNTER — Other Ambulatory Visit (HOSPITAL_COMMUNITY): Payer: Self-pay

## 2024-03-30 ENCOUNTER — Other Ambulatory Visit (HOSPITAL_COMMUNITY): Payer: Self-pay

## 2024-03-31 ENCOUNTER — Other Ambulatory Visit: Payer: Self-pay

## 2024-03-31 ENCOUNTER — Encounter: Payer: Self-pay | Admitting: Hematology

## 2024-04-01 ENCOUNTER — Inpatient Hospital Stay: Payer: Self-pay | Attending: Hematology | Admitting: Genetic Counselor

## 2024-04-01 ENCOUNTER — Encounter: Payer: Self-pay | Admitting: Genetic Counselor

## 2024-04-01 ENCOUNTER — Inpatient Hospital Stay: Payer: Self-pay | Attending: Hematology

## 2024-04-01 ENCOUNTER — Inpatient Hospital Stay: Payer: Self-pay | Admitting: Nutrition

## 2024-04-01 ENCOUNTER — Other Ambulatory Visit: Payer: Self-pay | Admitting: Genetic Counselor

## 2024-04-01 ENCOUNTER — Encounter: Payer: Self-pay | Admitting: Licensed Clinical Social Worker

## 2024-04-01 DIAGNOSIS — C182 Malignant neoplasm of ascending colon: Secondary | ICD-10-CM | POA: Insufficient documentation

## 2024-04-01 DIAGNOSIS — Z5111 Encounter for antineoplastic chemotherapy: Secondary | ICD-10-CM | POA: Insufficient documentation

## 2024-04-01 DIAGNOSIS — D6481 Anemia due to antineoplastic chemotherapy: Secondary | ICD-10-CM | POA: Insufficient documentation

## 2024-04-01 LAB — GENETIC SCREENING ORDER

## 2024-04-01 NOTE — Progress Notes (Signed)
 REFERRING PROVIDER: Lanny Callander, MD [address] [place],  [place] [address]  PRIMARY PROVIDER:  Pcp, No  PRIMARY REASON FOR VISIT:  1. Cancer of right colon (HCC)      HISTORY OF PRESENT ILLNESS:   William Blankenship, a 48 y.o. male, was seen for a [facility] cancer genetics consultation at the request of Dr. Lanny due to a personal history of colon cancer.  William Blankenship presents to clinic today to discuss the possibility of a hereditary predisposition to cancer, genetic testing, and to further clarify his future cancer risks, as well as potential cancer risks for family members.   In September 2025, at the age of 43, William Blankenship was diagnosed with colon cancer.There is loss of MSH6 suggesting that he has Lynch syndrome.     CANCER HISTORY:  Oncology History  Cancer of right colon (HCC)  01/18/2024 Cancer Staging   Staging form: Colon and Rectum, AJCC 8th Edition - Pathologic stage from 01/18/2024: Stage IIA (pT3, pN0, cM0) - Signed by Lanny Callander, MD on 02/03/2024 Total positive nodes: 0 Histologic grading system: 4 grade system Histologic grade (G): G3 Residual tumor (R): R0   02/03/2024 Initial Diagnosis   Cancer of right colon (HCC)   02/19/2024 -  Chemotherapy   Patient is on Treatment Plan : GASTRIC CapeOx (1000/130) q21d x 8 Cycles        Past Medical History:  Diagnosis Date   Abrasion of anterior lower leg 07/29/2012   Anemia    Back pain    Colonic mass    GERD (gastroesophageal reflux disease)    Hand injury 03/31/2013   Hyperlipidemia    Migraines    PATELLAR DISLOCATION, LEFT 01/31/2010   Qualifier: Diagnosis of  By: Rosalynn MD, Camie      Past Surgical History:  Procedure Laterality Date   conoscopy      LAPAROSCOPIC ILEOCECECTOMY Right 01/18/2024   Procedure: EXCISION, CECUM WITH ILEUM, LAPAROSCOPIC;  Surgeon: Teresa Lonni HERO, MD;  Location: [facility];  Service: General;  Laterality: Right;  LAPAROSCOPIC RIGHT HEMICOLECTOMY    Social  History   Socioeconomic History   Marital status: Married    Spouse name: Not on file   Number of children: 3   Years of education: Not on file   Highest education level: Not on file  Occupational History   Not on file  Tobacco Use   Smoking status: Former    Types: Cigarettes    Passive exposure: Yes   Smokeless tobacco: Never  Vaping Use   Vaping status: Never Used  Substance and Sexual Activity   Alcohol use: Yes    Comment: once every a few weeks   Drug use: No   Sexual activity: Yes  Other Topics Concern   Not on file  Social History Narrative   Not on file   Social Drivers of Health   Financial Resource Strain: Not on file  Food Insecurity: No Food Insecurity (01/18/2024)   Hunger Vital Sign    Worried About Running Out of Food in the Last Year: Never true    Ran Out of Food in the Last Year: Never true  Transportation Needs: No Transportation Needs (01/18/2024)   PRAPARE - Administrator, Civil Service (Medical): No    Lack of Transportation (Non-Medical): No  Physical Activity: Not on file  Stress: Not on file  Social Connections: Not on file     FAMILY HISTORY:  We obtained a detailed, 4-generation family  history.  Significant diagnoses are listed below: Family History  Problem Relation Age of Onset   Colon cancer Neg Hx    Colon polyps Neg Hx    Esophageal cancer Neg Hx    Rectal cancer Neg Hx    Stomach cancer Neg Hx      There is no known family history of cancer, however, the patient does not have any information on his biological father.  William Blankenship is unaware of previous family history of genetic testing for hereditary cancer risks. There is no reported Ashkenazi Jewish ancestry. There is no known consanguinity.  GENETIC COUNSELING ASSESSMENT: William Blankenship is a 48 y.o. male with a personal history of colon cancer which is somewhat suggestive of a Lynch syndrome and predisposition to cancer given his loss of MSH6 tumor  testing and his young age of onset. We, therefore, discussed and recommended the following at today's visit.   DISCUSSION: We discussed that, in general, most cancer is not inherited in families, but instead is sporadic or familial. Sporadic cancers occur by chance and typically happen at older ages (>50 years) as this type of cancer is caused by genetic changes acquired during an individual's lifetime. Some families have more cancers than would be expected by chance; however, the ages or types of cancer are not consistent with a known genetic mutation or known genetic mutations have been ruled out. This type of familial cancer is thought to be due to a combination of multiple genetic, environmental, hormonal, and lifestyle factors. While this combination of factors likely increases the risk of cancer, the exact source of this risk is not currently identifiable or testable.  We discussed that 5 - 10% of colon cancer is hereditary, with most cases associated with Lynch syndrome.  William Blankenship had tumor testing that showed a loss of MSH6.  This is suggestive of Lynch syndrome.  There are other genes that can be associated with hereditary colon cancer syndromes.  These include APC, MUTYH, BMPR1A and others.  These genes are associated with polyposis syndromes.  We discussed that testing is beneficial for several reasons including knowing how to follow individuals after completing their treatment, identifying whether potential treatment options such as PARP inhibitors would be beneficial, and understand if other family members could be at risk for cancer and allow them to undergo genetic testing.   We reviewed the characteristics, features and inheritance patterns of hereditary cancer syndromes. We also discussed genetic testing, including the appropriate family members to test, the process of testing, insurance coverage and turn-around-time for results. We discussed the implications of a negative, positive,  carrier and/or variant of uncertain significant result. William Blankenship  was offered a common hereditary cancer panel (36+ genes) and an expanded pan-cancer panel (70+ genes). William Blankenship was informed of the benefits and limitations of each panel, including that expanded pan-cancer panels contain genes that do not have clear management guidelines at this point in time.  We also discussed that as the number of genes included on a panel increases, the chances of variants of uncertain significance increases. William Blankenship decided to pursue genetic testing for the CancerNext+RNA gene panel.   The [facility] CancerNext+RNAinsight Panel includes sequencing, rearrangement analysis, and RNA analysis for the following 40 genes: APC, ATM, BAP1, BARD1, BMPR1A, BRCA1, BRCA2, BRIP1, CDH1, CDKN2A, CHEK2, FH, FLCN, MET, MLH1, MSH2, MSH6, MUTYH, NF1, NTHL1, PALB2, PMS2, PTEN, RAD51C, RAD51D, RPS20, SMAD4, STK11, TP53, TSC1, TSC2 and VHL (sequencing and deletion/duplication); AXIN2, HOXB13, MBD4, MSH3, POLD1 and POLE (  sequencing only); EPCAM and GREM1 (deletion/duplication only). RNA data is routinely analyzed for use in variant interpretation for all genes.   Based on William Blankenship personal history of cancer, he meets medical criteria for genetic testing.  Despite that he meets criteria, he may still have an out of pocket cost. We discussed that if his out of pocket cost for testing is over $100, the laboratory will call and confirm whether he wants to proceed with testing.  If the out of pocket cost of testing is less than $100 he will be billed by the genetic testing laboratory. William Blankenship applied for financial assistance and was approved for $0.  PLAN: After considering the risks, benefits, and limitations, William Blankenship provided informed consent to pursue genetic testing and the blood sample was sent to [facility] for analysis of the CancerNext+RNA. Results should be available  within approximately 2-3 weeks' time, at which point they will be disclosed by telephone to William Blankenship, as will any additional recommendations warranted by these results. William Blankenship will receive a summary of his genetic counseling visit and a copy of his results once available. This information will also be available in Epic.   Lastly, we encouraged William Blankenship to remain in contact with cancer genetics annually so that we can continuously update the family history and inform him of any changes in cancer genetics and testing that may be of benefit for this family.   Mr. Knotts questions were answered to his satisfaction today. Our contact information was provided should additional questions or concerns arise. Thank you for the referral and allowing us  to share in the care of your patient.   Justine Dines P. Perri, MS, CGC Licensed, Patent Attorney [email or web address] phone: [phone]  I personally spent a total of 50 minutes in the care of the patient today including preparing to see the patient, getting/reviewing separately obtained history, counseling and educating, placing orders, and documenting clinical information in the EHR. The patient was seen alone.  Drs. Lanny Stalls, and/or Gudena were available for questions, if needed..    _______________________________________________________________________ For Office Staff:  Number of people involved in session: 1 Was an Intern/ student involved with case: no

## 2024-04-01 NOTE — Progress Notes (Signed)
 Patient stopped RD and wanted a brief follow up. States he has been doing much better and thinks he is eating better. He is grateful for the help he has been given. He is trying to eat healthy foods and consume increased protein. Reports food insecurity and was provided a bag from the food pantry. I encouraged patient to contact me if he should have nutrition impact symptoms and weight loss.

## 2024-04-03 ENCOUNTER — Inpatient Hospital Stay: Payer: Self-pay | Admitting: Hematology

## 2024-04-03 ENCOUNTER — Encounter: Payer: Self-pay | Admitting: Hematology

## 2024-04-03 ENCOUNTER — Inpatient Hospital Stay: Payer: Self-pay

## 2024-04-03 ENCOUNTER — Other Ambulatory Visit (HOSPITAL_COMMUNITY): Payer: Self-pay

## 2024-04-03 ENCOUNTER — Other Ambulatory Visit: Payer: Self-pay

## 2024-04-03 VITALS — BP 117/68 | HR 65 | Temp 97.8°F | Resp 17 | Ht 65.0 in | Wt 152.8 lb

## 2024-04-03 VITALS — BP 140/89 | HR 79

## 2024-04-03 DIAGNOSIS — C182 Malignant neoplasm of ascending colon: Secondary | ICD-10-CM

## 2024-04-03 LAB — CBC WITH DIFFERENTIAL (CANCER CENTER ONLY)
Abs Immature Granulocytes: 0.01 K/uL (ref 0.00–0.07)
Basophils Absolute: 0 K/uL (ref 0.0–0.1)
Basophils Relative: 0 %
Eosinophils Absolute: 0.1 K/uL (ref 0.0–0.5)
Eosinophils Relative: 3 %
HCT: 35.4 % — ABNORMAL LOW (ref 39.0–52.0)
Hemoglobin: 11.4 g/dL — ABNORMAL LOW (ref 13.0–17.0)
Immature Granulocytes: 0 %
Lymphocytes Relative: 35 %
Lymphs Abs: 1.7 K/uL (ref 0.7–4.0)
MCH: 25.2 pg — ABNORMAL LOW (ref 26.0–34.0)
MCHC: 32.2 g/dL (ref 30.0–36.0)
MCV: 78.3 fL — ABNORMAL LOW (ref 80.0–100.0)
Monocytes Absolute: 0.6 K/uL (ref 0.1–1.0)
Monocytes Relative: 13 %
Neutro Abs: 2.3 K/uL (ref 1.7–7.7)
Neutrophils Relative %: 49 %
Platelet Count: 293 K/uL (ref 150–400)
RBC: 4.52 MIL/uL (ref 4.22–5.81)
RDW: 24.8 % — ABNORMAL HIGH (ref 11.5–15.5)
WBC Count: 4.8 K/uL (ref 4.0–10.5)
nRBC: 0 % (ref 0.0–0.2)

## 2024-04-03 LAB — CMP (CANCER CENTER ONLY)
ALT: 49 U/L — ABNORMAL HIGH (ref 0–44)
AST: 35 U/L (ref 15–41)
Albumin: 4.5 g/dL (ref 3.5–5.0)
Alkaline Phosphatase: 97 U/L (ref 38–126)
Anion gap: 12 (ref 5–15)
BUN: 29 mg/dL — ABNORMAL HIGH (ref 6–20)
CO2: 22 mmol/L (ref 22–32)
Calcium: 9.3 mg/dL (ref 8.9–10.3)
Chloride: 106 mmol/L (ref 98–111)
Creatinine: 0.94 mg/dL (ref 0.61–1.24)
GFR, Estimated: >60 mL/min (ref >60)
Glucose, Bld: 143 mg/dL — ABNORMAL HIGH (ref 70–99)
Potassium: 3.8 mmol/L (ref 3.5–5.1)
Sodium: 140 mmol/L (ref 135–145)
Total Bilirubin: 0.7 mg/dL (ref 0.0–1.2)
Total Protein: 7.8 g/dL (ref 6.5–8.1)

## 2024-04-03 MED ORDER — OXALIPLATIN CHEMO INJECTION 100 MG/20ML
130.0000 mg/m2 | Freq: Once | INTRAVENOUS | Status: AC
  Administered 2024-04-03: 200 mg via INTRAVENOUS
  Filled 2024-04-03: qty 40

## 2024-04-03 MED ORDER — ONDANSETRON HCL 8 MG PO TABS
8.0000 mg | ORAL_TABLET | Freq: Three times a day (TID) | ORAL | 1 refills | Status: DC | PRN
  Filled 2024-04-03: qty 20, 7d supply, fill #0
  Filled 2024-04-16: qty 20, 7d supply, fill #1

## 2024-04-03 MED ORDER — CAPECITABINE 500 MG PO TABS
ORAL_TABLET | ORAL | 1 refills | Status: DC
  Filled 2024-04-03: qty 84, fill #0
  Filled 2024-04-14: qty 84, 21d supply, fill #0
  Filled 2024-05-05: qty 84, 21d supply, fill #1

## 2024-04-03 MED ORDER — DULOXETINE HCL 20 MG PO CPEP
20.0000 mg | ORAL_CAPSULE | Freq: Every day | ORAL | 0 refills | Status: AC
  Filled 2024-04-03: qty 30, 30d supply, fill #0

## 2024-04-03 MED ADMIN — Dexamethasone Sod Phosphate Preservative Free Inj 10 MG/ML: 10 mg | INTRAVENOUS | NDC 00641036721

## 2024-04-03 MED ADMIN — Palonosetron HCl IV Soln 0.25 MG/5ML (Base Equivalent): 0.25 mg | INTRAVENOUS | NDC 60505619301

## 2024-04-03 MED FILL — Palonosetron HCl IV Soln 0.25 MG/5ML (Base Equivalent): 0.2500 mg | INTRAVENOUS | Qty: 5 | Status: AC

## 2024-04-03 NOTE — Patient Instructions (Signed)
 Instrucciones al darle de alta: Discharge Instructions Gracias por elegir al [facility] de [facility] de [place] para brindarle atencin mdica de oncologa y teacher, english as a foreign language.   Si usted tiene una cita de laboratorio con el [facility], por favor vaya directamente al [employer] de Cncer y regstrese en el rea de engineer, maintenance (it).   Use ropa cmoda y adecuada para tener fcil acceso a las vas del Portacath (acceso venoso de larga duracin) o la lnea PICC (catter central colocado por va perifrica).   Nos esforzamos por ofrecerle tiempo de calidad con su proveedor. Es posible que tenga que volver a programar su cita si llega tarde (15 minutos o ms).  El llegar tarde le afecta a usted y a otros pacientes cuyas citas son posteriores a armed forces operational officer.  Adems, si usted falta a tres o ms citas sin avisar a la oficina, puede ser retirado(a) de la clnica a discrecin del proveedor.      Para las solicitudes de renovacin de recetas, pida a su farmacia que se ponga en contacto con nuestra oficina y deje que transcurran 72 horas para que se complete el proceso de las renovaciones.    Hoy usted recibi los siguientes agentes de quimioterapia e/o inmunoterapia :Oxaliplatin (Eloxatin)   Para ayudar a prevenir las nuseas y los vmitos despus de su tratamiento, le recomendamos que tome su medicamento para las nuseas segn las indicaciones.  LOS SNTOMAS QUE DEBEN COMUNICARSE INMEDIATAMENTE SE INDICAN A CONTINUACIN: *FIEBRE SUPERIOR A 100.4 F (38 C) O MS *ESCALOFROS O SUDORACIN *NUSEAS Y VMITOS QUE NO SE CONTROLAN CON EL MEDICAMENTO PARA LAS NUSEAS *DIFICULTAD INUSUAL PARA RESPIRAR  *MORETONES O HEMORRAGIAS NO HABITUALES *PROBLEMAS URINARIOS (dolor o ardor al geographical information systems officer o frecuencia para geographical information systems officer) *PROBLEMAS INTESTINALES (diarrea inusual, estreimiento, dolor cerca del ano) SENSIBILIDAD EN LA BOCA Y EN LA GARGANTA CON O SIN LA PRESENCIA DE LCERAS (dolor de garganta, llagas en la boca o dolor de  muelas/dientes) ERUPCIN, HINCHAZN O DOLORES INUSUALES FLUJO VAGINAL INUSUAL O PICAZN/RASQUIA    Los puntos marcados con un asterisco ( *) indican una posible emergencia y debe hacer un seguimiento tan pronto como le sea posible o vaya al Departamento de Emergencias si se le presenta algn problema.  Por favor, muestre la [place] DE ADVERTENCIA DE [facility] [facility] DE ADVERTENCIA DE LTANYA al registrarse en [address] de Emergencias y a la enfermera de triaje.  Si tiene preguntas despus de su visita o necesita cancelar o volver a programar su cita, por favor pngase en contacto con [facility] - A [facility]PT OF [facility]  Dept: [phone] y siga las instrucciones. Las horas de oficina son de 8:00 a.m. a 4:30 p.m. de lunes a viernes. Por favor, tenga en cuenta que los mensajes de voz que se dejan despus de las 4:00 p.m. posiblemente no se devolvern hasta el siguiente da de trabajo.  Cerramos los fines de semana y [employer]. En todo momento tiene acceso a una enfermera para preguntas urgentes. Por favor, llame al nmero principal de la clnica Dept: [phone] y siga las instrucciones.   Para cualquier pregunta que no sea de carcter urgente, tambin puede ponerse en contacto con su proveedor utilizando MyChart. Ahora ofrecemos visitas electrnicas para cualquier persona mayor de 18 aos que solicite atencin mdica en lnea para los sntomas que no sean urgentes. Para ms detalles vaya a [email or web address].   Tambin puede bajar la aplicacin de MyChart! Vaya a la tienda de  aplicaciones, busque MyChart, abra la aplicacin, seleccione [place], e ingrese con su nombre de usuario y la contrasea de Clinical Cytogeneticist.

## 2024-04-03 NOTE — Progress Notes (Signed)
 Pt complaining of some numbness to bicep area of rt arm proximal to iv insertion area, no erythema or edema noted, notified Dr Lanny, and [facility] Mallie, PA, applied heat pack and warm towel to area

## 2024-04-03 NOTE — Progress Notes (Signed)
 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 -Colonoscopy biopsy confirmed adenocarcinoma.  He underwent right hemicolectomy and lymph node dissection.  Surgical path showed poorly differentiated adenocarcinoma, T3, all 44 lymph nodes negative with negative margins. -Due to his young age and poorly differentiated adenocarcinoma, and MSH disease, I recommend adjuvant chemotherapy CapeOx or FOLFOX for 3 months. 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  Assessment & Plan Malignant neoplasm of ascending colon He is receiving adjuvant chemotherapy for ascending colon cancer, with two cycles completed and the third administered today. He remains fully active and has resumed full-time work. Chemotherapy has been tolerated with expected side effects and no significant complications. Genetic counseling and molecular testing are in progress, with results pending. - Administered third cycle of adjuvant chemotherapy. - Scheduled final chemotherapy cycle for January 2nd. - Planned follow-up visit one month after last chemotherapy cycle. - Planned surveillance CT scan in March post-chemotherapy. - Discussed Signatera circulating tumor DNA test to be performed after chemotherapy. - Reviewed normal tumor marker results. - Confirmed completion of genetic counseling and blood draw; awaiting results.  Chronic pain related to colon surgery He  reports persistent, localized musculoskeletal pain exacerbated by physical activity at work, inadequately controlled with acetaminophen  but not severe enough to disrupt sleep. He requested alternative pain management and declined opioid therapy. - Prescribed low-dose duloxetine for pain management. - Advised nighttime dosing initially to monitor for drowsiness. - Provided option to titrate duloxetine dose if pain control remains insufficient. - Discussed duloxetine's dual role in pain and mood management.  Adverse effects of antineoplastic chemotherapy He experiences expected chemotherapy-related side effects, including dysgeusia, cold sensitivity, fatigue, and mild nausea, which improve as the cycle progresses. - Prescribed ondansetron  for nausea management. - Reviewed typical chemotherapy side effects and their expected course.  Anemia secondary to chemotherapy His anemia remains stable on recent laboratory testing, with no evidence of worsening or symptomatic anemia. Other hematologic parameters are within normal limits. - Reviewed recent blood counts (stable anemia, normal other counts).  Plan - He is clinically stable, he has returned to work last week. - I called in Cymbalta 20 mg daily for his abdominal pain, we do not plan to refill his oxycodone . - Lab reviewed, adequate for treatment, will proceed to cycle 3 CapeOx today - He will return in 3 weeks before last cycle chemo.   SUMMARY OF ONCOLOGIC HISTORY: Oncology History  Cancer of right colon (HCC)  01/18/2024 Cancer Staging   Staging form: Colon and Rectum, AJCC 8th Edition - Pathologic stage from 01/18/2024: Stage IIA (pT3, pN0, cM0) - Signed by Lanny Callander, MD on 02/03/2024 Total positive nodes: 0 Histologic grading system: 4 grade system Histologic grade (G): G3 Residual tumor (R): R0   02/03/2024 Initial Diagnosis   Cancer of right colon (HCC)   02/19/2024 -  Chemotherapy   Patient is on Treatment Plan : GASTRIC  CapeOx (1000/130) q21d x 8 Cycles  Discussed the use of AI scribe software for clinical note transcription with the patient, who gave verbal consent to proceed.  History of Present Illness William Blankenship is a 48 year old male with malignant neoplasm of the ascending colon, status post resection, currently receiving adjuvant chemotherapy, who presents for follow-up to assess treatment tolerance and symptom management.  He is receiving adjuvant infusional chemotherapy plus oral capecitabine , currently on cycle 3 of 4, with prior cycles tolerated with expected side effects and no significant complications.  Since returning to full-time work involving frequent bending and heavy lifting two weeks ago, he has had progressively worsening localized pain with physical activity that does not disrupt sleep and is not relieved by acetaminophen . He requests additional pain control and previously discussed this with Dr. Teresa. He denies syncope.  After chemotherapy infusions he has dysgeusia and cold sensitivity in his mouth, which improve over the week after infusion and while off oral capecitabine . He also has fatigue, mild nausea, and decreased appetite post-infusion that gradually improve, but he is eating well and reports weight stability or gain, with current weight 152 lb. He received his oral chemotherapy but not his antiemetic and requests an ondansetron  refill.  He had blood drawn for genetic counseling two days ago and is awaiting results. He denies other new symptoms or concerns.     All other systems were reviewed with the patient and are negative.  MEDICAL HISTORY:  Past Medical History:  Diagnosis Date   Abrasion of anterior lower leg 07/29/2012   Anemia    Back pain    Colonic mass    GERD (gastroesophageal reflux disease)    Hand injury 03/31/2013   Hyperlipidemia    Migraines    PATELLAR DISLOCATION, LEFT 01/31/2010   Qualifier: Diagnosis of  By: Rosalynn MD, Camie       SURGICAL HISTORY: Past Surgical History:  Procedure Laterality Date   conoscopy      LAPAROSCOPIC ILEOCECECTOMY Right 01/18/2024   Procedure: EXCISION, CECUM WITH ILEUM, LAPAROSCOPIC;  Surgeon: Teresa Lonni HERO, MD;  Location: [facility];  Service: General;  Laterality: Right;  LAPAROSCOPIC RIGHT HEMICOLECTOMY    I have reviewed the social history and family history with the patient and they are unchanged from previous note.  ALLERGIES:  has no known allergies.  MEDICATIONS:  Current Outpatient Medications  Medication Sig Dispense Refill   DULoxetine (CYMBALTA) 20 MG capsule Take 1 capsule (20 mg total) by mouth daily. 30 capsule 0   acetaminophen  (TYLENOL ) 325 MG tablet Take 650 mg by mouth every 6 (six) hours as needed for moderate pain (pain score 4-6).     azelastine  (ASTELIN ) 0.1 % nasal spray Place 1 spray into both nostrils 2 (two) times daily. Use in each nostril as directed 30 mL 1   Calcium Carbonate (CALCIUM 500 PO) Take 1 tablet by mouth daily. (Patient not taking: Reported on 02/08/2024)     CALCIUM MAGNESIUM ZINC PO Take 1 tablet by mouth daily. (Patient not taking: Reported on 02/08/2024)     capecitabine  (XELODA ) 500 MG tablet Take 3 tablets (1500 mg total) by mouth 2 (two) times daily after meals. Take 30 minutes after meal. Take for 14 days, then off for 7 days. Repeat every 21 days. Start on same day of IV chemo 84 tablet 1   Cetirizine  HCl 10 MG CAPS Take 1 capsule (10 mg total) by mouth daily for 10 days. (Patient not taking: Reported on 02/08/2024) 10 capsule 0   fluticasone  (FLONASE ) 50 MCG/ACT nasal  spray Place 1-2 sprays into both nostrils daily for 7 days. (Patient not taking: No sig reported) 1 g 0   ibuprofen  (ADVIL ) 600 MG tablet Take 1 tablet (600 mg total) by mouth every 6 (six) hours as needed. (Patient not taking: Reported on 02/08/2024) 30 tablet 0   Iron  Combinations (IRON  COMPLEX PO) Take 1 tablet by mouth daily.     Misc Natural Products (APPLE CIDER  VINEGAR DIET PO) Take 1 capsule by mouth daily. (Patient not taking: Reported on 02/08/2024)     Nutritional Supplements (ENSURE ORIGINAL) LIQD Take 1 Bottle by mouth daily.     ondansetron  (ZOFRAN ) 8 MG tablet Take 1 tablet (8 mg total) by mouth every 8 (eight) hours as needed for nausea or vomiting. 20 tablet 1   OVER THE COUNTER MEDICATION Take 1 Scoop by mouth daily. Elviation whey protein blend     prochlorperazine  (COMPAZINE ) 10 MG tablet Take 1 tablet (10 mg total) by mouth every 6 (six) hours as needed for nausea or vomiting. 30 tablet 2   No current facility-administered medications for this visit.   Facility-Administered Medications Ordered in Other Visits  Medication Dose Route Frequency Provider Last Rate Last Admin   dextrose  5 % solution   Intravenous Continuous Lanny Callander, MD 10 mL/hr at 04/03/24 1005 New Bag at 04/03/24 1005   oxaliplatin  (ELOXATIN ) 200 mg in dextrose  5 % 500 mL chemo infusion  130 mg/m2 (Treatment Plan Recorded) Intravenous Once Lanny Callander, MD 270 mL/hr at 04/03/24 1054 200 mg at 04/03/24 1054    PHYSICAL EXAMINATION: ECOG PERFORMANCE STATUS: 1 - Symptomatic but completely ambulatory  Vitals:   04/03/24 0826  BP: 117/68  Pulse: 65  Resp: 17  Temp: 97.8 F (36.6 C)  SpO2: 99%   Wt Readings from Last 3 Encounters:  04/03/24 152 lb 12.8 oz (69.3 kg)  03/13/24 149 lb (67.6 kg)  02/19/24 146 lb 6.4 oz (66.4 kg)     GENERAL:alert, no distress and comfortable SKIN: skin color, texture, turgor are normal, no rashes or significant lesions EYES: normal, Conjunctiva are pink and non-injected, sclera clear NECK: supple, thyroid normal size, non-tender, without nodularity LYMPH:  no palpable lymphadenopathy in the cervical, axillary  LUNGS: clear to auscultation and percussion with normal breathing effort HEART: regular rate & rhythm and no murmurs and no lower extremity edema ABDOMEN:abdomen soft, non-tender and normal bowel sounds Musculoskeletal:no  cyanosis of digits and no clubbing  NEURO: alert & oriented x 3 with fluent speech, no focal motor/sensory deficits  Physical Exam MEASUREMENTS: Weight- 152.  LABORATORY DATA:  I have reviewed the data as listed    Latest Ref Rng & Units 04/03/2024    7:50 AM 03/13/2024   11:58 AM 02/19/2024    9:46 AM  CBC  WBC 4.0 - 10.5 K/uL 4.8  4.9  7.6   Hemoglobin 13.0 - 17.0 g/dL 88.5  87.9  89.4   Hematocrit 39.0 - 52.0 % 35.4  37.3  33.6   Platelets 150 - 400 K/uL 293  355  383         Latest Ref Rng & Units 04/03/2024    7:50 AM 03/13/2024   11:58 AM 02/19/2024    9:46 AM  CMP  Glucose 70 - 99 mg/dL 856  871  895   BUN 6 - 20 mg/dL 29  18  17    Creatinine 0.61 - 1.24 mg/dL 9.05  9.17  9.14   Sodium 135 - 145 mmol/L 140  138  140   Potassium 3.5 - 5.1 mmol/L 3.8  3.7  3.6   Chloride 98 - 111 mmol/L 106  102  108   CO2 22 - 32 mmol/L 22  23  27    Calcium 8.9 - 10.3 mg/dL 9.3  9.5  9.3   Total Protein 6.5 - 8.1 g/dL 7.8  7.9  7.4   Total Bilirubin 0.0 - 1.2 mg/dL 0.7  0.5  0.3   Alkaline Phos 38 - 126 U/L 97  111  78   AST 15 - 41 U/L 35  50  18   ALT 0 - 44 U/L 49  104  45       RADIOGRAPHIC STUDIES: I have personally reviewed the radiological images as listed and agreed with the findings in the report. No results found.    No orders of the defined types were placed in this encounter.  All questions were answered. The patient knows to call the clinic with any problems, questions or concerns. No barriers to learning was detected. The total time spent in the appointment was 25 minutes, including review of chart and various tests results, discussions about plan of care and coordination of care plan     Onita Mattock, MD 04/03/2024

## 2024-04-03 NOTE — Assessment & Plan Note (Signed)
 pT3N0M0 stage II, G3, dMMR with MSH6 loss  - Patient presents with worsening intermittent right sided abdominal pain, CT on December 24, 2023 showed large mass in the ascending colon with colonic and mesenteric lymphadenopathy, no distant metastasis. -Colonoscopy biopsy confirmed adenocarcinoma.  He underwent right hemicolectomy and lymph node dissection.  Surgical path showed poorly differentiated adenocarcinoma, T3, all 44 lymph nodes negative with negative margins. -Due to his young age and poorly differentiated adenocarcinoma, and MSH disease, I recommend adjuvant chemotherapy CapeOx or FOLFOX for 3 months. 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

## 2024-04-04 ENCOUNTER — Other Ambulatory Visit: Payer: Self-pay

## 2024-04-14 ENCOUNTER — Other Ambulatory Visit: Payer: Self-pay

## 2024-04-14 ENCOUNTER — Encounter: Payer: Self-pay | Admitting: Hematology

## 2024-04-16 ENCOUNTER — Other Ambulatory Visit: Payer: Self-pay

## 2024-04-16 ENCOUNTER — Encounter: Payer: Self-pay | Admitting: Hematology

## 2024-04-16 NOTE — Progress Notes (Signed)
 Specialty Pharmacy Refill Coordination Note  William Blankenship is a 48 y.o. male contacted today regarding refills of specialty medication(s) Capecitabine  (XELODA )   Patient requested Delivery   Delivery date: 04/21/24   Verified address: [address] [place] [address]   Medication will be filled on: 04/18/24

## 2024-04-17 ENCOUNTER — Other Ambulatory Visit: Payer: Self-pay

## 2024-04-18 ENCOUNTER — Other Ambulatory Visit: Payer: Self-pay

## 2024-04-21 ENCOUNTER — Encounter: Payer: Self-pay | Admitting: Hematology

## 2024-04-21 ENCOUNTER — Other Ambulatory Visit (HOSPITAL_COMMUNITY): Payer: Self-pay

## 2024-04-22 ENCOUNTER — Telehealth: Payer: Self-pay | Admitting: Genetic Counselor

## 2024-04-22 ENCOUNTER — Encounter: Payer: Self-pay | Admitting: Genetic Counselor

## 2024-04-22 DIAGNOSIS — Z1379 Encounter for other screening for genetic and chromosomal anomalies: Secondary | ICD-10-CM | POA: Insufficient documentation

## 2024-04-22 DIAGNOSIS — Z1509 Genetic susceptibility to other malignant neoplasm: Secondary | ICD-10-CM | POA: Insufficient documentation

## 2024-04-22 DIAGNOSIS — Z15060 Genetic susceptibility to colorectal cancer: Secondary | ICD-10-CM | POA: Insufficient documentation

## 2024-04-22 NOTE — Telephone Encounter (Signed)
 LM with wife that I called and to please have him call back.  Left CB instructions.

## 2024-04-23 ENCOUNTER — Telehealth: Payer: Self-pay

## 2024-04-23 NOTE — Telephone Encounter (Signed)
 Spoke with patient's spouse and confirmed appointment for Lab and Folfox infusion on Friday, 04/25/2024 at 0830. Spouse verbalized understanding of the appointment date, time, and location. Clinic contact number was provided for any changes or questions.

## 2024-04-25 ENCOUNTER — Inpatient Hospital Stay: Payer: Self-pay | Attending: Hematology

## 2024-04-25 ENCOUNTER — Telehealth: Payer: Self-pay | Admitting: Hematology

## 2024-04-25 ENCOUNTER — Other Ambulatory Visit: Payer: Self-pay | Admitting: Nurse Practitioner

## 2024-04-25 ENCOUNTER — Inpatient Hospital Stay: Payer: Self-pay

## 2024-04-25 VITALS — BP 124/79 | HR 69 | Temp 98.7°F | Resp 13 | Wt 160.2 lb

## 2024-04-25 DIAGNOSIS — Z15060 Genetic susceptibility to colorectal cancer: Secondary | ICD-10-CM | POA: Insufficient documentation

## 2024-04-25 DIAGNOSIS — C182 Malignant neoplasm of ascending colon: Secondary | ICD-10-CM | POA: Insufficient documentation

## 2024-04-25 DIAGNOSIS — Z5111 Encounter for antineoplastic chemotherapy: Secondary | ICD-10-CM | POA: Insufficient documentation

## 2024-04-25 DIAGNOSIS — Z1509 Genetic susceptibility to other malignant neoplasm: Secondary | ICD-10-CM | POA: Insufficient documentation

## 2024-04-25 DIAGNOSIS — Z79630 Long term (current) use of alkylating agent: Secondary | ICD-10-CM | POA: Insufficient documentation

## 2024-04-25 LAB — CMP (CANCER CENTER ONLY)
ALT: 46 U/L — ABNORMAL HIGH (ref 0–44)
AST: 36 U/L (ref 15–41)
Albumin: 4.4 g/dL (ref 3.5–5.0)
Alkaline Phosphatase: 123 U/L (ref 38–126)
Anion gap: 11 (ref 5–15)
BUN: 17 mg/dL (ref 6–20)
CO2: 22 mmol/L (ref 22–32)
Calcium: 9.6 mg/dL (ref 8.9–10.3)
Chloride: 104 mmol/L (ref 98–111)
Creatinine: 0.82 mg/dL (ref 0.61–1.24)
GFR, Estimated: >60 mL/min
Glucose, Bld: 129 mg/dL — ABNORMAL HIGH (ref 70–99)
Potassium: 3.9 mmol/L (ref 3.5–5.1)
Sodium: 137 mmol/L (ref 135–145)
Total Bilirubin: 0.5 mg/dL (ref 0.0–1.2)
Total Protein: 7.9 g/dL (ref 6.5–8.1)

## 2024-04-25 LAB — CBC WITH DIFFERENTIAL (CANCER CENTER ONLY)
Abs Immature Granulocytes: 0.01 K/uL (ref 0.00–0.07)
Basophils Absolute: 0 K/uL (ref 0.0–0.1)
Basophils Relative: 1 %
Eosinophils Absolute: 0.2 K/uL (ref 0.0–0.5)
Eosinophils Relative: 5 %
HCT: 38.3 % — ABNORMAL LOW (ref 39.0–52.0)
Hemoglobin: 12.8 g/dL — ABNORMAL LOW (ref 13.0–17.0)
Immature Granulocytes: 0 %
Lymphocytes Relative: 38 %
Lymphs Abs: 1.7 K/uL (ref 0.7–4.0)
MCH: 27.5 pg (ref 26.0–34.0)
MCHC: 33.4 g/dL (ref 30.0–36.0)
MCV: 82.4 fL (ref 80.0–100.0)
Monocytes Absolute: 0.6 K/uL (ref 0.1–1.0)
Monocytes Relative: 13 %
Neutro Abs: 1.9 K/uL (ref 1.7–7.7)
Neutrophils Relative %: 43 %
Platelet Count: 272 K/uL (ref 150–400)
RBC: 4.65 MIL/uL (ref 4.22–5.81)
RDW: 25.1 % — ABNORMAL HIGH (ref 11.5–15.5)
WBC Count: 4.4 K/uL (ref 4.0–10.5)
nRBC: 0 % (ref 0.0–0.2)

## 2024-04-25 LAB — CEA (ACCESS): CEA (CHCC): 1.63 ng/mL (ref 0.00–5.00)

## 2024-04-25 LAB — FERRITIN: Ferritin: 44 ng/mL (ref 24–336)

## 2024-04-25 MED ORDER — PALONOSETRON HCL INJECTION 0.25 MG/5ML
0.2500 mg | Freq: Once | INTRAVENOUS | Status: AC
  Administered 2024-04-25: 0.25 mg via INTRAVENOUS
  Filled 2024-04-25: qty 5

## 2024-04-25 MED ORDER — OXALIPLATIN CHEMO INJECTION 100 MG/20ML
130.0000 mg/m2 | Freq: Once | INTRAVENOUS | Status: AC
  Administered 2024-04-25: 200 mg via INTRAVENOUS
  Filled 2024-04-25: qty 40

## 2024-04-25 MED ORDER — DEXTROSE 5 % IV SOLN: INTRAVENOUS | Status: DC

## 2024-04-25 MED ORDER — DEXAMETHASONE SOD PHOSPHATE PF 10 MG/ML IJ SOLN
10.0000 mg | Freq: Once | INTRAMUSCULAR | Status: AC
  Administered 2024-04-25: 10 mg via INTRAVENOUS

## 2024-04-25 NOTE — Progress Notes (Signed)
 Pt declined an interpreter today. I reinforced that I can have a video interpreter available at any time should he need it. He voiced understanding.   He is a patient of Dr. Lanny at [facility] and needs a follow up appointment with her as this is his last treatment. I messaged Shanda Pope and Heather Boscia at that campus and they will make him an appointment (now scheduled for 05/15/24 with Dr. Lanny).  Pt c/o slight tingling in his left hand near the end of his oxaliplatin  infusion. Infusion stopped and IV flushed with saline (by Katelin S, RN). Patient with blood return noted. Infusion continued and pt will notify his provider if tingling continues. IV site clean, dry and intact\ without swelling.

## 2024-04-25 NOTE — Patient Instructions (Signed)
 Instrucciones al darle de alta: Discharge Instructions Gracias por elegir al [facility] de Cncer de [place] para [place] atencin mdica de oncologa y teacher, english as a foreign language.   Si usted tiene una cita de laboratorio con el Cent[facility] de Cncer, por favor vaya directamente al [employer] de Cncer y regstrese en el rea de engineer, maintenance (it).   Use ropa cmoda y adecuada para tener fcil acceso a las vas del Portacath (acceso venoso de larga duracin) o la lnea PICC (catter central colocado por va perifrica).   Nos esforzamos por ofrecerle tiempo de calidad con su proveedor. Es posible que tenga que volver a programar su cita si llega tarde (15 minutos o ms).  El llegar tarde le afecta a usted y a otros pacientes cuyas citas son posteriores a armed forces operational officer.  Adems, si usted falta a tres o ms citas sin avisar a la oficina, puede ser retirado(a) de la clnica a discrecin del proveedor.      Para las solicitudes de renovacin de recetas, pida a su farmacia que se ponga en contacto con nuestra oficina y deje que transcurran 72 horas para que se complete el proceso de las renovaciones.    Hoy usted recibi los siguientes agentes de quimioterapia e/o inmunoterapia Oxaliplatin  (ELOXATIN ).      Para ayudar a prevenir las nuseas y los vmitos despus de su tratamiento, le recomendamos que tome su medicamento para las nuseas segn las indicaciones.  LOS SNTOMAS QUE DEBEN COMUNICARSE INMEDIATAMENTE SE INDICAN A CONTINUACIN: *FIEBRE SUPERIOR A 100.4 F (38 C) O MS *ESCALOFROS O SUDORACIN *NUSEAS Y VMITOS QUE NO SE CONTROLAN CON EL MEDICAMENTO PARA LAS NUSEAS *DIFICULTAD INUSUAL PARA RESPIRAR  *MORETONES O HEMORRAGIAS NO HABITUALES *PROBLEMAS URINARIOS (dolor o ardor al geographical information systems officer o frecuencia para geographical information systems officer) *PROBLEMAS INTESTINALES (diarrea inusual, estreimiento, dolor cerca del ano) SENSIBILIDAD EN LA BOCA Y EN LA GARGANTA CON O SIN LA PRESENCIA DE LCERAS (dolor de garganta, llagas en la boca o dolor de  muelas/dientes) ERUPCIN, HINCHAZN O DOLORES INUSUALES FLUJO VAGINAL INUSUAL O PICAZN/RASQUIA    Los puntos marcados con un asterisco ( *) indican una posible emergencia y debe hacer un seguimiento tan pronto como le sea posible o vaya al Departamento de Emergencias si se le presenta algn problema.  Por favor, muestre la [place] DE ADVERTENCIA DE BABS [place] DE ADVERTENCIA DE LTANYA al registrarse en [address] de Emergencias y a la enfermera de triaje.  Si tiene preguntas despus de su visita o necesita cancelar o volver a programar su cita, por favor pngase en contacto con [facility]  Dept: [phone]  y siga las instrucciones. Las horas de oficina son de 8:00 a.m. a 4:30 p.m. de lunes a viernes. Por favor, tenga en cuenta que los mensajes de voz que se dejan despus de las 4:00 p.m. posiblemente no se devolvern hasta el siguiente da de trabajo.  Cerramos los fines de semana y [employer] company. En todo momento tiene acceso a una enfermera para preguntas urgentes. Por favor, llame al nmero principal de la clnica Dept: [phone] y siga las instrucciones.   Para cualquier pregunta que no sea de carcter urgente, tambin puede ponerse en contacto con su proveedor utilizando MyChart. Ahora ofrecemos visitas electrnicas para cualquier persona mayor de 18 aos que solicite atencin mdica en lnea para los sntomas que no sean urgentes. Para ms detalles vaya a [email or web address].   Tambin puede bajar la aplicacin de MyChart! Vaya a la  tienda de aplicaciones, busque MyChart, abra la aplicacin, seleccione [place], e ingrese con su nombre de usuario y la contrasea de Clinical Cytogeneticist.  Oxaliplatin  Injection Qu es este medicamento? El OXALIPLATINO trata el cncer colorrectal. Acta desacelerando el crecimiento de clulas cancerosas. Este medicamento puede ser utilizado para otros usos;  si tiene alguna pregunta consulte con su proveedor de atencin mdica o con su farmacutico. MARCAS COMUNES: Eloxatin  Qu le debo informar a mi profesional de la salud antes de tomar este medicamento? Necesitan saber si usted presenta alguno de los siguientes problemas o situaciones: Enfermedad cardiaca Antecedentes de frecuencia o ritmo cardiaco irregular Enfermedad heptica Niveles bajos de clulas sanguneas (glbulos blancos, glbulos rojos y plaquetas) Enfermedad pulmonar o respiratoria, como asma Si usa  medicamentos que tratan o previenen cogulos sanguneos Hormigueo en los dedos de las manos o los pies, u otro trastorno del sistema nervioso Una reaccin alrgica o inusual al oxaliplatino, a otros medicamentos, alimentos, colorantes o conservantes Si usted o su pareja est embarazada o intentando quedar embarazada Si est amamantando a un beb Cmo debo utilizar este medicamento? Este medicamento se inyecta en una vena. Su equipo de atencin lo auto-owners insurance en un hospital o en un entorno clnico. Hable con su equipo de atencin sobre el uso de este medicamento en nios. Puede requerir atencin especial. Sobredosis: Pngase en contacto inmediatamente con un centro toxicolgico o una sala de urgencia si usted cree que haya tomado demasiado medicamento.<br>ATENCIN: Reynolds american es solo para usted. No comparta este medicamento con nadie. Qu sucede si me olvido de una dosis? Cumpla con las citas para dosis de seguimiento. Es importante no olvidar ninguna dosis. Llame a su equipo de atencin si no puede asistir a una cita. Qu puede interactuar con este medicamento? No use este medicamento con ninguno de los siguientes productos: Cisaprida Dronedarona Pimozida Tioridazina Este medicamento tambin podra interactuar con los siguientes productos: Aspirina y otros medicamentos tipo aspirina Ciertos medicamentos que tratan o previenen cogulos sanguneos, tales como warfarina,  apixabn, dabigatrn y rivaroxabn Cisplatino Ciclosporina Diurticos Medicamentos para las infecciones tales como aciclovir, adefovir, anfotericina B, bacitracina, cidofovir, foscarnet, ganciclovir, gentamicina, pentamidina, vancomicina AINE, medicamentos para el dolor y la inflamacin, tales como ibuprofeno o naproxeno Otros medicamentos que causan cambios en el ritmo cardiaco Pamidronato cido zoledrnico Puede ser que esta lista no menciona todas las posibles interacciones. Informe a su profesional de beazer homes de ingram micro inc productos a base de hierbas, medicamentos de [place] o suplementos nutritivos que est tomando. Si usted fuma, consume bebidas alcohlicas o si utiliza drogas ilegales, indqueselo tambin a su profesional de beazer homes. Algunas sustancias pueden interactuar con su medicamento. A qu debo estar atento al usar ppl corporation? Se supervisar su estado de salud atentamente mientras reciba este medicamento. Usted podra necesitar realizarse anlisis de sangre mientras est usando este medicamento. Este medicamento podra hacerle sentir un risk analyst. Esto no es inusual, ya que la quimioterapia puede afectar tanto a las clulas sanas como a las clulas cancerosas. Si presenta algn efecto secundario, infrmelo. Contine con el tratamiento incluso si se siente enfermo, a menos que su equipo de 3m company lo suspenda. Este medicamento puede aumentar su riesgo de contraer una infeccin. Llame para pedir consejo a su equipo de atencin si tiene fiebre, escalofros, dolor de garganta o cualquier otro sntoma de resfriado o gripe. No se trate usted mismo. Trate de no acercarse a personas que estn enfermas. Evite usar medicamentos que contengan aspirina, acetaminofeno, ibuprofeno, naproxeno o ketoprofeno, a menos  que as lo indique su equipo de atencin. Estos medicamentos pueden ocultar la fiebre. Proceda con cuidado al cepillar sus dientes, usar hilo dental o  utilizar palillos para los dientes, ya que podra contraer una infeccin o geophysicist/field seismologist con mayor facilidad. Si recibe algn tratamiento dental, informe a su dentista que est usando este medicamento. Este medicamento puede aumentar su sensibilidad al fro. No beba bebidas fras ni use hielo. Cubra la piel expuesta antes de entrar en contacto con temperaturas bajas u objetos fros. Cuando est a la j. c. penney, use ropa abrigada y cbrase la boca y la nariz para calentar el aire que ingresa a los pulmones. Informe a su equipo de atencin si se vuelve sensible al fro. Informe a su equipo de atencin si usted o su pareja est embarazada o si cree que alguna de las woodsside podra [address]. Este medicamento puede causar defectos congnitos graves si se usa  durante el embarazo y por 9 meses despus de la ltima dosis. Se requiere una prueba de embarazo con resultado negativo antes de games developer a producer, television/film/video. Se recomienda utilizar un mtodo anticonceptivo confiable mientras est usando este medicamento y durante 9 meses despus de la ltima dosis. Hable con su equipo de atencin sobre mtodos anticonceptivos eficaces. No embarace a careers information officer est usando este medicamento y durante 6 meses despus de la ltima dosis. Use un condn durante las relaciones sexuales que mantenga en [place]. No debe amamantar a un beb mientras usa  este medicamento y por 3 meses despus de la ltima dosis. Este medicamento podra causar infertilidad. Hable con su equipo de atencin si le preocupa su fertilidad. Qu efectos secundarios puedo tener al [place] scientific este medicamento? Efectos secundarios que debe informar a su equipo de atencin tan pronto como sea posible: Reacciones alrgicas: erupcin cutnea, comezn/picazn, urticaria, hinchazn de la cara, los labios, la lengua o la garganta Sangrado: heces con [place], o de color negro y water engineer alquitranado, vomitar sangre o material marrn que tiene  el aspecto de posos (residuos) de caf, orina de color rojo o marrn oscuro, pequeas manchas rojas o moradas en la piel, sangrado o moretones inusuales Tos seca, falta de aire o problemas para Probation Officer en el ritmo cardiaco: frecuencia cardiaca rpida o irregular, mareos, sensacin de desmayo o aturdimiento, journalist, newspaper, dificultad para respirar Infeccin: fiebre, escalofros, tos, dolor de garganta, heridas que no sanan, dolor o problemas para geographical information systems officer, sensacin general de molestia o malestar Lesin en el hgado: dolor en la regin abdominal superior derecha, prdida de apetito, nuseas, heces de color claro, orina amarilla oscura o marrn, color amarillento de los ojos o la piel, debilidad o fatiga inusuales Recuento bajo de glbulos rojos: debilidad o fatiga inusuales, mareo, dolor de cabeza, dificultad para respirar Lesin muscular: debilidad o fatiga inusuales, dolor muscular, orina amarilla oscura o marrn, disminucin de la cantidad de orina Dolor, hormigueo o entumecimiento de las manos o los pies Dolor de cabeza repentino e intenso, confusin, cambio en la visin, convulsiones, que podran ser signos de sndrome de encefalopata posterior reversible Sangrado o moretones inusuales Efectos secundarios que generalmente no requieren atencin mdica (debe informarlos a su equipo de atencin si persisten o si son molestos): Diarrea Therapist, Sports, enrojecimiento o hinchazn con llagas dentro de la boca o la garganta Debilidad o fatiga inusuales Vmito Puede ser que esta lista no menciona todos los posibles efectos secundarios. Comunquese a su mdico por asesoramiento mdico [employer]. Usted puede [employer]  secundarios a la FDA por telfono al 1-800-FDA-1088. Dnde debo guardar mi medicina? Este medicamento se administra en hospitales o clnicas. No se guarda en su casa. ATENCIN: Este folleto es un resumen. Puede ser que no cubra toda la posible  informacin. Si usted tiene preguntas acerca de esta medicina, consulte con su mdico, su farmacutico o su profesional de radiographer, therapeutic.  2024 Elsevier/Gold Standard (2022-05-16 00:00:00)

## 2024-04-27 ENCOUNTER — Other Ambulatory Visit: Payer: Self-pay

## 2024-04-27 ENCOUNTER — Ambulatory Visit: Payer: Self-pay | Admitting: Nurse Practitioner

## 2024-04-28 ENCOUNTER — Ambulatory Visit: Payer: Self-pay | Admitting: Licensed Clinical Social Worker

## 2024-04-28 ENCOUNTER — Encounter: Payer: Self-pay | Admitting: Licensed Clinical Social Worker

## 2024-04-28 ENCOUNTER — Encounter: Payer: Self-pay | Admitting: Hematology

## 2024-04-28 DIAGNOSIS — Z1501 Genetic susceptibility to malignant neoplasm of breast: Secondary | ICD-10-CM | POA: Insufficient documentation

## 2024-04-28 NOTE — Telephone Encounter (Signed)
 I contacted Mr. Colpitts to discuss his genetic testing results. 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 Patient is scheduled to see Zoe Lindsey-Mills on 05/15/2024 at 10 am to discuss the result in detail.    The test report has been scanned into EPIC and is located under the Molecular Pathology section of the Results Review tab.  A portion of the result report is included below for reference.      Dena Cary, MS, [facility] Genetic Counselor [email or web address] Phone: [phone]

## 2024-05-02 ENCOUNTER — Other Ambulatory Visit: Payer: Self-pay

## 2024-05-05 ENCOUNTER — Encounter: Payer: Self-pay | Admitting: Hematology

## 2024-05-05 ENCOUNTER — Other Ambulatory Visit: Payer: Self-pay

## 2024-05-06 ENCOUNTER — Encounter: Payer: Self-pay | Admitting: Hematology

## 2024-05-06 ENCOUNTER — Other Ambulatory Visit: Payer: Self-pay

## 2024-05-06 DIAGNOSIS — C182 Malignant neoplasm of ascending colon: Secondary | ICD-10-CM

## 2024-05-06 MED ORDER — CAPECITABINE 500 MG PO TABS
ORAL_TABLET | ORAL | 1 refills | Status: AC
  Filled 2024-05-07: qty 84, 21d supply, fill #0

## 2024-05-06 MED ORDER — ONDANSETRON HCL 8 MG PO TABS
8.0000 mg | ORAL_TABLET | Freq: Three times a day (TID) | ORAL | 1 refills | Status: AC | PRN
  Filled 2024-05-06: qty 20, 7d supply, fill #0

## 2024-05-06 NOTE — Telephone Encounter (Addendum)
 Called patient to make him aware of the results below as per Lacie Burton NP, patient voiced full understanding, he needed the meds refilled I sent the refills to [facility] for Xeloda  and his Zofran .    ----- Message from Lacie Burton, NP sent at 04/27/2024 10:05 AM EST ----- Please let pt know labs look good, please continue xeloda  as prescribed and keep next appts.  Thanks Lacie NP

## 2024-05-07 ENCOUNTER — Other Ambulatory Visit: Payer: Self-pay

## 2024-05-07 ENCOUNTER — Encounter: Payer: Self-pay | Admitting: Hematology

## 2024-05-09 ENCOUNTER — Other Ambulatory Visit: Payer: Self-pay

## 2024-05-14 NOTE — Progress Notes (Unsigned)
 GENETIC TEST RESULTS  Patient Name: William Blankenship Patient Age: 49 y.o. Encounter Date: 05/15/2024  Referring Provider: ***   Mr. Casten was seen in the Cancer Genetics clinic on *** due to a {Personal/family:20331} history of cancer and concern regarding a hereditary predisposition to cancer in the family. Please refer to the prior Genetics clinic note for more information regarding Mr. Lizer medical and family histories and our assessment at the time.   FAMILY HISTORY:  We obtained a detailed, 4-generation family history.  Significant diagnoses are listed below: Family History  Problem Relation Age of Onset   Colon cancer Neg Hx    Colon polyps Neg Hx    Esophageal cancer Neg Hx    Rectal cancer Neg Hx    Stomach cancer Neg Hx     Patient's maternal ancestors are of *** descent, and paternal ancestors are of *** descent. There {IS NO:12509} reported Ashkenazi Jewish ancestry. There {IS NO:12509} known consanguinity.  ***   ***  GENETIC TEST RESULTS:  Genetic testing reported on {report date} through the *** panel offered by {lab}. 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 There were no deleterious mutations in ***.  [GT for K[facility]] GENETIC TESTING: At the time of Mr. Jaimes-Garcia's visit, we recommended he pursue genetic testing for the specific *** mutation that was identified in the family. The genetic testing {report date} through the *** Cancer Panel offered by {lab} identified a single, heterozygous pathogenic gene mutation called ***, ***.   {GENE LIST}. The test report will be scanned into EPIC and located under the Molecular Pathology section of the Results Review tab.  A portion of the result report is included below for reference.   ***  ***Genetic testing did identify a variant of uncertain significance (VUS) in the *** gene called ***.  At this time, it is unknown if this variant is associated with  increased cancer risk or if this is a normal finding, but most variants such as this get reclassified to being inconsequential. It should not be used to make medical management decisions. With time, we suspect the lab will determine the significance of this variant, if any. If we do learn more about it, we will try to contact Mr. Vanatta to discuss it further. However, it is important to stay in touch with us  periodically and keep the address and phone number up to date.  ***Genetic testing did identify {number} variants of uncertain significance (VUS) - one in the *** gene called ***, a second in the *** gene called ***, and a third in the *** gene called ***.  At this time, it is unknown if these variants are associated with increased cancer risk or if they are normal findings, but most variants such as these get reclassified to being inconsequential. They should not be used to make medical management decisions. With time, we suspect the lab will determine the significance of these variants, if any. If we do learn more about them, we will try to contact Mr. Pousson to discuss it further. However, it is important to stay in touch with us  periodically and keep the address and phone number up to date.   CLINICAL INFORMATION:  Pathogenic variants or mutations in MSH6 are associated with a condition called Lynch syndrome. Lynch syndrome increases risk for colorectal, endometrial, ovarian, urinary tract, and other cancers. The cancer risks and management recommendations associated with MSH6 mutations are listed below:  Type of Cancer MSH6 Mutation Lifetime  Risk Average Lifetime Risk  Colorectal 10% - 44% 4.0%  Endometrial (uterus) 16% - 49% 3.1%  Ovarian Up to 1.0% -13% 1.1%  Renal pelvis/ureter 0.7% - 5.5% -   Bladder 1.0% - 8.2% 2.2%  Gastric (stomach) Up to 1.0% - 7.9% 0.8%  Small bowel Up to 1.0% - 4.0% 0.3%  Pancreas 1.4% - 1.6% 1.7%  Biliary Tract Up to 1% -  Prostate 2.5% - 11.6%  12.8%  Brain 0.8% - 1.8% 0.6%  Source: NCCN Guidelines: Genetic/Familial High-Risk Assessment: Colorectal, Endometrial, Gastric v. 1, 2025.  Benign and malignant skin tumors such as sebaceous adenomas, sebaceous adenocarcinomas, and keratoacanthomas has been reported to be increased among patients with Lynch syndrome. Cumulative lifetime risk specific to MSH6 mutation carriers is not available.  Risk numbers are based on NCCN v***. Risk estimates may evolve over time as new information is learned about MSH6 mutations.    Management Recommendations: Per NCCN v***  Colorectal Cancer Screening: High quality colonoscopy at age 19-35 (or 2-5 years prior to the earliest colon cancer if it is diagnosed before age 25) and repeat every 1-3 years Consider using daily aspirin to reduce the risk of colorectal cancer. The decision to use aspirin should be made on an individual basis, including discussion of individual risks, benefits, adverse effects, and childbearing plans with a healthcare provider.   Endometrial Cancer Screening/Risk Reduction: Women should report any abnormal uterine bleeding or postmenopausal bleeding. The evaluation of these symptoms should include an endometrial biopsy. A hysterectomy may be considered. The timing should be individualized based on whether childbearing is complete, comorbidities, and family history. Hysterectomy (surgery to remove the uterus) with opportunistic salpingectomy (elective removal of both fallopian tubes during another abdominal surgery to reduce risk for ovarian cancer) can be considered at age 56, with bilateral oophorectomy (surgery to remove ovaries) at age 58. Endometrial cancer screening does not have a proven benefit in women with Lynch Syndrome. However, endometrial biopsy is highly sensitive and specific as a diagnostic procedure. Screening via endometrial biopsy every 1-2 years starting at age 48-35 can be considered. Transvaginal ultrasounds may  be considered in postmenopausal women at their clinician's discretion.  Ovarian Cancer Screening/Risk Reduction: A prophylactic bilateral salpingo-oophorectomy (BSO), or having the ovaries and fallopian tubes removed, may be considered. A BSO is estimated to reduce the risk of ovarian cancer by up to 96%. Timing of a BSO should be individualized based on whether childbearing is complete, menopause status, comorbidities, and family history.  Hysterectomy (surgery to remove the uterus) with opportunistic salpingectomy (elective removal of both fallopian tubes during another abdominal surgery to reduce risk for ovarian cancer) can be considered at age 38, with bilateral oophorectomy (surgery to remove ovaries) at age 1. Women should be aware of symptoms that might be associated with the development of ovarian cancer including pelvic or abdominal pain, bloating, increased abdominal girth, difficulty eating, early satiety, or urinary frequency or urgency. Symptoms that persist for several weeks and are a change from a woman's baseline should prompt evaluation by her physician. Current data does not support routine ovarian screening for Lynch syndrome, therefore it may be considered at the clinician's discretion. Screening includes transvaginal ultrasounds and a blood test to measure CA-125 levels every 6-12 months.  Urothelial Cancer Screening: Annual urinalysis starting at age 22-35 may be considered in selected individuals such as those with a family history of urothelial cancer (renal pelvis, ureter, and/or bladder).   Gastric and Small Bowel Cancer Screening: Esophagogastroduodenoscopy (EGD) starting at age  30-40 years and repeat every 2-4 years, preferably performed in conjunction with colonoscopy.  Age of initiation prior to 30 years and/or surveillance interval less than 2 years may be considered based on family history or high-risk endoscopic findings. Random biopsy of the proximal and distal  stomach should at minimum be performed on the initial procedure to assess for H. Pylori, autoimmune gastritis, and intestinal metaplasia.  Push enteroscopy can be considered in place of EGD to enhance small bowel visualization. Individuals not undergoing upper endoscopic surveillance should have one-time noninvasive testing for H. pylori at the time of Lynch Syndrome diagnosis, with treatment indicated if H. pylori is detected.    Pancreatic Cancer Screening: Avoid smoking, heavy alcohol use, and obesity. It has been suggested that pancreatic cancer screening be limited to those with a family history of pancreatic cancer (first- or second-degree relative). Ideally, screening should be performed in experienced centers utilizing a multidisciplinary approach under research conditions. Recommended screening include annual endoscopic ultrasound (preferred) and/or MRI of the pancreas starting at age 72 or 60 years younger than the earliest age diagnosis in the family. Annual concurrent CA19-9 testing should also be considered.  Brain Cancer Screening: Patients should be educated regarding signs and symptoms of neurologic cancer and the importance of prompt reporting of abnormal symptoms to their physicians.  Prostate Cancer Screening: Consider beginning annual PSA blood test and baseline digital rectal exam at age 78.  Skin Manifestations:  Consider skin exam every 1-2 years with a healthcare provider experienced in identifying skin manifestations of Lynch syndrome.   Additional Considerations: Patients of reproductive age should be made aware of options for prenatal diagnosis and assisted reproduction including pre-implantation genetic diagnosis. Individuals with a single pathogenic MSH6 variant are also carriers of constitutional mismatch repair deficiency (CMMRD) syndrome. CMMRD is a childhood-onset cancer predisposition syndrome that can present with hematological malignancies, cancers of the brain  and central nervous system, Lynch syndrome-associated cancers (colon, uterine, small bowel, urinary tract), embryonic tumors, and sarcomas. Some affected individuals may also display cafe-au-lait macules. For there to be a risk of CMMRD in offspring, an individual and their partner would each have to have a single pathogenic variant in the same MMR gene; in such a case, the risk of having an affected child is 25%.  This information is based on current understanding of the gene and may change in the future.    IMPLICATIONS FOR FAMILY MEMBERS: Individuals with MSH6 are encouraged to share information about their genetic test results with family members. A family letter will be provided to help share this result with relatives. Cascade screening is a systematic process through which additional family members with a MSH6 mutation are identified, starting with all first degree relatives over the age of 82 (parents, siblings, and children) of people who have a MSH6 mutation.   Hereditary predisposition to cancer due to pathogenic variants in the MSH6 gene has autosomal dominant inheritance. This means that an individual with a pathogenic variant has a 50% chance of passing the condition on to their children. Identification of a pathogenic variant allows for the recognition of at-risk relatives who can pursue testing for the familial variant.  Family members are encouraged to consider genetic testing for this familial pathogenic variant. As there are generally no childhood cancer risks associated with pathogenic variants in the MSH6 gene, individuals in the family are not typically recommended to have testing until they reach at least 48 years of age. They may contact our office at [phone] for  more information or to schedule an appointment. Family members who live outside of the area are encouraged to find a genetic counselor in their area by visiting:  [email or web address].  RESOURCES: Facing Our Risk of Cancer Empowered (FORCE): www.facingourrisk.org   Colon Cancer Alliance for Research and Education for Lynch Syndrome: snitchseek.be  ICARE Inherited Cancer Registry: https://inheritedcancer.net/   PLAN:  Referral to Gastroenterology is recommended to further discuss options for colon cancer screening and prevention.   ***Referral to [facility] Gynecologic Oncology is recommended to further discuss options for gynecologic cancer prevention.   Referral to ***  Results will be shared with Mr. Jaimes-Garcia's primary care provider, Pcp, No, and OBGYN, ***, for further management.   Lastly, we encouraged Mr. Monger to remain in contact with cancer genetics annually so that we can continuously update the family history and inform him of any changes in recommendations for ***, updates to knowledge on cancer genetics and additional testing that may be of benefit for this family.   Mr. Branch questions were answered to his satisfaction today. Our contact information was provided should additional questions or concerns arise. Thank you for the referral and allowing us  to share in the care of your patient.   Burnard Ogren, MS, [facility] Licensed, [email or web address] phone: [phone]  *** minutes were spent on the date of the encounter in service to the patient including preparation, face-to-face consultation, documentation and care coordination.  *** The patient was seen alone.  ***The patient brought ***. Drs. Lanny Stalls, and/or Gudena were available for questions, if needed..    _______________________________________________________________________ For Office Staff:  Number of people involved in session: *** Was an Intern/ student involved with case: {YES/NO:63}

## 2024-05-15 ENCOUNTER — Inpatient Hospital Stay: Payer: Self-pay

## 2024-05-15 ENCOUNTER — Inpatient Hospital Stay: Payer: Self-pay | Admitting: Hematology

## 2024-05-15 VITALS — BP 132/78 | HR 59 | Temp 98.0°F | Resp 18 | Ht 65.0 in | Wt 161.5 lb

## 2024-05-15 DIAGNOSIS — C182 Malignant neoplasm of ascending colon: Secondary | ICD-10-CM

## 2024-05-15 DIAGNOSIS — Z1509 Genetic susceptibility to other malignant neoplasm: Secondary | ICD-10-CM

## 2024-05-15 DIAGNOSIS — Z15068 Genetic susceptibility to other malignant neoplasm of digestive system: Secondary | ICD-10-CM

## 2024-05-15 DIAGNOSIS — Z15060 Genetic susceptibility to colorectal cancer: Secondary | ICD-10-CM

## 2024-05-15 DIAGNOSIS — Z1507 Genetic susceptibility to malignant neoplasm of urinary tract: Secondary | ICD-10-CM

## 2024-05-15 LAB — COMPREHENSIVE METABOLIC PANEL WITH GFR
ALT: 28 U/L (ref 0–44)
AST: 29 U/L (ref 15–41)
Albumin: 4.4 g/dL (ref 3.5–5.0)
Alkaline Phosphatase: 99 U/L (ref 38–126)
Anion gap: 11 (ref 5–15)
BUN: 16 mg/dL (ref 6–20)
CO2: 22 mmol/L (ref 22–32)
Calcium: 9 mg/dL (ref 8.9–10.3)
Chloride: 106 mmol/L (ref 98–111)
Creatinine, Ser: 0.8 mg/dL (ref 0.61–1.24)
GFR, Estimated: >60 mL/min
Glucose, Bld: 88 mg/dL (ref 70–99)
Potassium: 3.4 mmol/L — ABNORMAL LOW (ref 3.5–5.1)
Sodium: 139 mmol/L (ref 135–145)
Total Bilirubin: 0.6 mg/dL (ref 0.0–1.2)
Total Protein: 7.9 g/dL (ref 6.5–8.1)

## 2024-05-15 LAB — CBC WITH DIFFERENTIAL/PLATELET
Abs Immature Granulocytes: 0 K/uL (ref 0.00–0.07)
Basophils Absolute: 0 K/uL (ref 0.0–0.1)
Basophils Relative: 1 %
Eosinophils Absolute: 0.1 K/uL (ref 0.0–0.5)
Eosinophils Relative: 3 %
HCT: 36.8 % — ABNORMAL LOW (ref 39.0–52.0)
Hemoglobin: 12.5 g/dL — ABNORMAL LOW (ref 13.0–17.0)
Immature Granulocytes: 0 %
Lymphocytes Relative: 38 %
Lymphs Abs: 1.7 K/uL (ref 0.7–4.0)
MCH: 28.7 pg (ref 26.0–34.0)
MCHC: 34 g/dL (ref 30.0–36.0)
MCV: 84.4 fL (ref 80.0–100.0)
Monocytes Absolute: 0.7 K/uL (ref 0.1–1.0)
Monocytes Relative: 15 %
Neutro Abs: 1.9 K/uL (ref 1.7–7.7)
Neutrophils Relative %: 43 %
Platelets: 206 K/uL (ref 150–400)
RBC: 4.36 MIL/uL (ref 4.22–5.81)
RDW: 25.2 % — ABNORMAL HIGH (ref 11.5–15.5)
WBC: 4.4 K/uL (ref 4.0–10.5)
nRBC: 0 % (ref 0.0–0.2)

## 2024-05-15 NOTE — Assessment & Plan Note (Signed)
 pT3N0M0 stage II, G3, dMMR with MSH6 loss  - Patient presents with worsening intermittent right sided abdominal pain, CT on December 24, 2023 showed large mass in the ascending colon with colonic and mesenteric lymphadenopathy, no distant metastasis. -Colonoscopy biopsy confirmed adenocarcinoma.  He underwent right hemicolectomy and lymph node dissection.  Surgical path showed poorly differentiated adenocarcinoma, T3, all 44 lymph nodes negative with negative margins. -Due to his young age and poorly differentiated adenocarcinoma, and MSH disease, I recommend adjuvant chemotherapy CapeOx or FOLFOX for 3 months. He started CAPOX on 02/19/2024, plan for 4 cycles

## 2024-05-15 NOTE — Assessment & Plan Note (Signed)
 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

## 2024-05-16 ENCOUNTER — Other Ambulatory Visit: Payer: Self-pay

## 2024-05-17 ENCOUNTER — Encounter: Payer: Self-pay | Admitting: Hematology

## 2024-05-17 NOTE — Progress Notes (Signed)
 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 -Colonoscopy biopsy confirmed adenocarcinoma.  He underwent right hemicolectomy and lymph node dissection.  Surgical path showed poorly differentiated adenocarcinoma, T3, all 44 lymph nodes negative with negative margins. -Due to his young age and poorly differentiated adenocarcinoma, and MSH disease, I recommend adjuvant chemotherapy CapeOx or FOLFOX for 3 months. 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 No evidence of recurrence on the most recent scan and no new symptoms suggestive of relapse, but ongoing surveillance is planned to monitor for potential recurrence. Financial barriers to ongoing surveillance were addressed. - Ordered surveillance CT scan to monitor for recurrence. - Planned follow-up in three months to review imaging results. - Discussed financial assistance options for imaging and laboratory studies, including [facility] discount, free care, and orange card. - Planned to order [employer] blood test for molecular residual disease monitoring at next visit, contingent on financial assistance  eligibility. - Sent message to social worker Isidoro Adder) to assist with financial resources.  MSH6-related Lynch syndrome Confirmed MSH6 mutation conferring Lynch syndrome, increasing risk for colorectal and other malignancies. Family members are at risk and require genetic testing. Annual colonoscopic surveillance is indicated for cancer prevention and early detection. - Reinforced need for annual colonoscopy for surveillance. - Advised him to inform children and siblings about the need for genetic testing for Lynch syndrome. - Encouraged continued follow-up with gastroenterology for annual colonoscopy.  Chemotherapy-induced peripheral neuropathy He experiences mild, intermittent numbness and paresthesia in the upper extremities and tongue, exacerbated by cold, with persistent but improving localized symptoms at the infusion site. Symptoms are likely secondary to prior chemotherapy and infusion site irritation, without evidence of progression or severe neuropathy. - Provided reassurance that neuropathy symptoms should gradually improve. - Advised monitoring for persistent or worsening symptoms.  Dizziness and near-syncope He reported two recent episodes of dizziness and near-syncope, including one fall. Etiology is unclear; possible causes include blood pressure fluctuations, cardiac arrhythmia, or post-chemotherapy weakness. No ongoing symptoms or chest pain. Home blood pressure monitor may be unreliable. He does not currently have a cardiologist or primary care provider. - Advised him to check blood pressure and heart rate during future episodes using a reliable monitor. - Recommended obtaining a new blood pressure monitor if current device is unreliable. - Advised follow-up with primary care physician for further evaluation if symptoms persist. - Discussed possible referral to cardiology if episodes continue after recovery from chemotherapy.  Plan - He has completed adjuvant chemotherapy,  will continue cancer surveillance - I recommend ctDNA [employer], spoke with [employer] and he may be eligible for free testing. - Patient is uninsured.  I messaged social worker Devere, regarding  his referral to [facility] health internal medicine to establish PCP, and apply for orange-card, [facility] discounted care etc -lab and f/u in 3 months    SUMMARY OF ONCOLOGIC HISTORY: Oncology History  Cancer of right colon (HCC)  01/18/2024 Cancer Staging   Staging form: Colon and Rectum, AJCC 8th Edition - Pathologic stage from 01/18/2024: Stage IIA (pT3, pN0, cM0) - Signed by Lanny Callander, MD on 02/03/2024 Total positive nodes: 0 Histologic grading system: 4 grade system Histologic grade (G): G3 Residual tumor (R): R0   02/03/2024 Initial Diagnosis   Cancer of right colon (HCC)   02/19/2024 -  Chemotherapy   Patient is on Treatment Plan : GASTRIC CapeOx (1000/130) q21d x 8 Cycles      04/15/2024 Genetic Testing   MSH6 p.L996* (c.2987T>A) Pathogenic mutation The report date is April 15, 2024.  The [facility] CancerNext+RNAinsight Panel includes sequencing, rearrangement analysis, and RNA analysis for the following 40 genes: APC, ATM, BAP1, BARD1, BMPR1A, BRCA1, BRCA2, BRIP1, CDH1, CDKN2A, CHEK2, FH, FLCN, MET, MLH1, MSH2, MSH6, MUTYH, NF1, NTHL1, PALB2, PMS2, PTEN, RAD51C, RAD51D, RPS20, SMAD4, STK11, TP53, TSC1, TSC2 and VHL (sequencing and deletion/duplication); AXIN2, HOXB13, MBD4, MSH3, POLD1 and POLE (sequencing only); EPCAM and GREM1 (deletion/duplication only). RNA data is routinely analyzed for use in variant interpretation for all genes.       Discussed the use of AI scribe software for clinical note transcription with the patient, who gave verbal consent to proceed.  History of Present Illness William Blankenship is a 49 year old male with T3N0 ascending colon cancer associated with Lynch syndrome who presents for post-treatment oncology follow-up and surveillance planning.  He completed  four cycles of adjuvant chemotherapy this month. He has returned to intermittent work, but treatment toxicities limit full-time work.  He has persistent but gradually improving neuropathy with numbness and tingling in the left arm at and distal to the peripheral infusion site. Symptoms were worst after the last infusion and are now slowly resolving. He also has intermittent tongue numbness, especially with cold exposure. He describes a residual needle-like sensation at the infusion site. He denies new or progressive neuropathic symptoms.  He has had two recent episodes of dizziness and near-syncope, one at home before work that caused imbalance without loss of consciousness. His son helped him to a chair, and he did not work that day. He reports two falls, including one related to the near-syncope and another at work where he fell backward into a bush. He denies ongoing chest pain, pressure, or discomfort, aside from a brief localized pain with laughing that resolved. He denies fevers, chills, or sweats.  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 He understands his Lynch syndrome diagnosis and that his children and siblings have been advised to undergo genetic testing.  He lacks health insurance and pays out of pocket with occasional discounts or payment plans. He requests assistance identifying financial resources to support ongoing surveillance imaging and laboratory monitoring.     All other systems were reviewed with the patient and are negative.  MEDICAL HISTORY:  Past Medical History:  Diagnosis Date   Abrasion of anterior lower leg 07/29/2012   Anemia    Back pain    Colonic mass    GERD (gastroesophageal reflux disease)    Hand injury 03/31/2013   Hyperlipidemia    Migraines    PATELLAR DISLOCATION, LEFT 01/31/2010    Qualifier:  Diagnosis of  By: Rosalynn MD, Camie      SURGICAL HISTORY: Past Surgical History:  Procedure Laterality Date   conoscopy      LAPAROSCOPIC ILEOCECECTOMY Right 01/18/2024   Procedure: EXCISION, CECUM WITH ILEUM, LAPAROSCOPIC;  Surgeon: Teresa Lonni HERO, MD;  Location: [facility];  Service: General;  Laterality: Right;  LAPAROSCOPIC RIGHT HEMICOLECTOMY    I have reviewed the social history and family history with the patient and they are unchanged from previous note.  ALLERGIES:  has no known allergies.  MEDICATIONS:  Current Outpatient Medications  Medication Sig Dispense Refill   acetaminophen  (TYLENOL ) 325 MG tablet Take 650 mg by mouth every 6 (six) hours as needed for moderate pain (pain score 4-6).     azelastine  (ASTELIN ) 0.1 % nasal spray Place 1 spray into both nostrils 2 (two) times daily. Use in each nostril as directed 30 mL 1   Calcium Carbonate (CALCIUM 500 PO) Take 1 tablet by mouth daily. (Patient not taking: Reported on 02/08/2024)     CALCIUM MAGNESIUM ZINC PO Take 1 tablet by mouth daily. (Patient not taking: Reported on 02/08/2024)     capecitabine  (XELODA ) 500 MG tablet Take 3 tablets (1500 mg total) by mouth 2 (two) times daily after meals. Take 30 minutes after meal. Take for 14 days, then off for 7 days. Repeat every 21 days. Start on same day of IV chemo 84 tablet 1   Cetirizine  HCl 10 MG CAPS Take 1 capsule (10 mg total) by mouth daily for 10 days. (Patient not taking: Reported on 02/08/2024) 10 capsule 0   DULoxetine  (CYMBALTA ) 20 MG capsule Take 1 capsule (20 mg total) by mouth daily. 30 capsule 0   fluticasone  (FLONASE ) 50 MCG/ACT nasal spray Place 1-2 sprays into both nostrils daily for 7 days. (Patient not taking: No sig reported) 1 g 0   ibuprofen  (ADVIL ) 600 MG tablet Take 1 tablet (600 mg total) by mouth every 6 (six) hours as needed. (Patient not taking: Reported on 02/08/2024) 30 tablet 0   Iron  Combinations (IRON  COMPLEX PO) Take 1 tablet by mouth  daily.     Misc Natural Products (APPLE CIDER VINEGAR DIET PO) Take 1 capsule by mouth daily. (Patient not taking: Reported on 02/08/2024)     Nutritional Supplements (ENSURE ORIGINAL) LIQD Take 1 Bottle by mouth daily.     ondansetron  (ZOFRAN ) 8 MG tablet Take 1 tablet (8 mg total) by mouth every 8 (eight) hours as needed for nausea or vomiting. 20 tablet 1   OVER THE COUNTER MEDICATION Take 1 Scoop by mouth daily. Elviation whey protein blend     prochlorperazine  (COMPAZINE ) 10 MG tablet Take 1 tablet (10 mg total) by mouth every 6 (six) hours as needed for nausea or vomiting. 30 tablet 2   No current facility-administered medications for this visit.    PHYSICAL EXAMINATION: ECOG PERFORMANCE STATUS: 1 - Symptomatic but completely ambulatory  Vitals:   05/15/24 1138  BP: 132/78  Pulse: (!) 59  Resp: 18  Temp: 98 F (36.7 C)  SpO2: 99%   Wt Readings from Last 3 Encounters:  05/15/24 161 lb 8 oz (73.3 kg)  04/25/24 160 lb 3.2 oz (72.7 kg)  04/03/24 152 lb 12.8 oz (69.3 kg)     GENERAL:alert, no distress and comfortable SKIN: skin color, texture, turgor are normal, no rashes or significant lesions EYES: normal, Conjunctiva are pink and non-injected, sclera clear NECK: supple, thyroid normal size, non-tender, without nodularity LYMPH:  no palpable lymphadenopathy  in the cervical, axillary  LUNGS: clear to auscultation and percussion with normal breathing effort HEART: regular rate & rhythm and no murmurs and no lower extremity edema ABDOMEN:abdomen soft, non-tender and normal bowel sounds Musculoskeletal:no cyanosis of digits and no clubbing  NEURO: alert & oriented x 3 with fluent speech, no focal motor/sensory deficits  Physical Exam    LABORATORY DATA:  I have reviewed the data as listed    Latest Ref Rng & Units 05/15/2024   11:17 AM 04/25/2024    7:48 AM 04/03/2024    7:50 AM  CBC  WBC 4.0 - 10.5 K/uL 4.4  4.4  4.8   Hemoglobin 13.0 - 17.0 g/dL 87.4  87.1  88.5    Hematocrit 39.0 - 52.0 % 36.8  38.3  35.4   Platelets 150 - 400 K/uL 206  272  293         Latest Ref Rng & Units 05/15/2024   11:17 AM 04/25/2024    7:48 AM 04/03/2024    7:50 AM  CMP  Glucose 70 - 99 mg/dL 88  870  856   BUN 6 - 20 mg/dL 16  17  29    Creatinine 0.61 - 1.24 mg/dL 9.19  9.17  9.05   Sodium 135 - 145 mmol/L 139  137  140   Potassium 3.5 - 5.1 mmol/L 3.4  3.9  3.8   Chloride 98 - 111 mmol/L 106  104  106   CO2 22 - 32 mmol/L 22  22  22    Calcium 8.9 - 10.3 mg/dL 9.0  9.6  9.3   Total Protein 6.5 - 8.1 g/dL 7.9  7.9  7.8   Total Bilirubin 0.0 - 1.2 mg/dL 0.6  0.5  0.7   Alkaline Phos 38 - 126 U/L 99  123  97   AST 15 - 41 U/L 29  36  35   ALT 0 - 44 U/L 28  46  49       RADIOGRAPHIC STUDIES: I have personally reviewed the radiological images as listed and agreed with the findings in the report. No results found.    No orders of the defined types were placed in this encounter.  All questions were answered. The patient knows to call the clinic with any problems, questions or concerns. No barriers to learning was detected. The total time spent in the appointment was 30 minutes, including review of chart and various tests results, discussions about plan of care and coordination of care plan     Onita Mattock, MD 05/17/2024     "

## 2024-05-22 ENCOUNTER — Other Ambulatory Visit: Payer: Self-pay

## 2024-05-22 ENCOUNTER — Inpatient Hospital Stay: Payer: Self-pay | Admitting: Licensed Clinical Social Worker

## 2024-05-22 DIAGNOSIS — C182 Malignant neoplasm of ascending colon: Secondary | ICD-10-CM

## 2024-05-22 NOTE — Progress Notes (Signed)
 [facility] CSW Progress Note   Interventions: CSW received request to assist pt to establish w/ a PCP and apply for an [employer].  CSW contacted pt's wife by phone utilizing interpreter services.  Contact information given for 4 [facility] clinics to establish w/ a PCP who will also assist pt to apply for an [employer].  Pt's wife advised to book future scans directly w/ [facility] in order to be eligible for discounts and to set up a payment plan for future surveillance scans.        Follow Up Plan:  Patient will contact CSW with any support or resource needs    Devere JONELLE Manna, LCSW Clinical Social Worker [facility]

## 2024-06-05 ENCOUNTER — Inpatient Hospital Stay: Payer: Self-pay | Admitting: Hematology

## 2024-06-05 ENCOUNTER — Inpatient Hospital Stay: Payer: Self-pay

## 2024-06-05 ENCOUNTER — Inpatient Hospital Stay: Payer: Self-pay | Attending: Hematology

## 2024-06-26 ENCOUNTER — Inpatient Hospital Stay: Payer: Self-pay

## 2024-06-26 ENCOUNTER — Inpatient Hospital Stay: Payer: Self-pay | Admitting: Hematology

## 2024-06-26 ENCOUNTER — Inpatient Hospital Stay: Payer: Self-pay | Attending: Hematology

## 2024-07-17 ENCOUNTER — Inpatient Hospital Stay: Payer: Self-pay

## 2024-07-17 ENCOUNTER — Inpatient Hospital Stay: Payer: Self-pay | Admitting: Hematology

## 2024-08-13 ENCOUNTER — Inpatient Hospital Stay: Payer: Self-pay | Attending: Hematology

## 2024-08-13 ENCOUNTER — Inpatient Hospital Stay: Payer: Self-pay | Admitting: Hematology
